# Patient Record
Sex: Male | Born: 1965 | Hispanic: Yes | Marital: Married | Smoking: Former smoker
Health system: Southern US, Community
[De-identification: ages and names within clinical notes are randomized; demographics above are authoritative.]

## PROBLEM LIST (undated history)

## (undated) DIAGNOSIS — K219 Gastro-esophageal reflux disease without esophagitis: Secondary | ICD-10-CM

## (undated) HISTORY — DX: Gastro-esophageal reflux disease without esophagitis: K21.9

## (undated) HISTORY — PX: BACK SURGERY: SHX140

---

## 2008-06-09 ENCOUNTER — Ambulatory Visit: Payer: Self-pay | Admitting: Occupational Medicine

## 2009-01-17 HISTORY — PX: OTHER SURGICAL HISTORY: SHX169

## 2009-03-25 ENCOUNTER — Ambulatory Visit: Payer: Self-pay | Admitting: Family Medicine

## 2009-03-25 DIAGNOSIS — K219 Gastro-esophageal reflux disease without esophagitis: Secondary | ICD-10-CM | POA: Insufficient documentation

## 2009-03-26 ENCOUNTER — Encounter: Payer: Self-pay | Admitting: Family Medicine

## 2009-03-26 LAB — CONVERTED CEMR LAB
Alkaline Phosphatase: 104 units/L (ref 39–117)
CO2: 28 meq/L (ref 19–32)
Calcium: 9.3 mg/dL (ref 8.4–10.5)
Creatinine, Ser: 0.94 mg/dL (ref 0.40–1.50)
Glucose, Bld: 108 mg/dL — ABNORMAL HIGH (ref 70–99)
Helicobacter Pylori Antibody-IgG: <0.4
Potassium: 4.2 meq/L (ref 3.5–5.3)
Sodium: 138 meq/L (ref 135–145)
Total Bilirubin: 0.5 mg/dL (ref 0.3–1.2)

## 2009-04-01 ENCOUNTER — Encounter: Payer: Self-pay | Admitting: Family Medicine

## 2009-05-04 ENCOUNTER — Telehealth: Payer: Self-pay | Admitting: Family Medicine

## 2009-06-11 ENCOUNTER — Encounter (INDEPENDENT_AMBULATORY_CARE_PROVIDER_SITE_OTHER): Payer: Self-pay | Admitting: *Deleted

## 2009-06-11 ENCOUNTER — Telehealth: Payer: Self-pay | Admitting: Family Medicine

## 2009-07-17 ENCOUNTER — Encounter: Payer: Self-pay | Admitting: Family Medicine

## 2009-07-28 ENCOUNTER — Telehealth (INDEPENDENT_AMBULATORY_CARE_PROVIDER_SITE_OTHER): Payer: Self-pay | Admitting: *Deleted

## 2009-09-01 ENCOUNTER — Encounter: Admission: RE | Admit: 2009-09-01 | Discharge: 2009-09-01 | Payer: Self-pay | Admitting: Family Medicine

## 2009-09-01 ENCOUNTER — Ambulatory Visit: Payer: Self-pay | Admitting: Family Medicine

## 2009-09-01 DIAGNOSIS — M549 Dorsalgia, unspecified: Secondary | ICD-10-CM | POA: Insufficient documentation

## 2009-09-02 ENCOUNTER — Encounter: Payer: Self-pay | Admitting: Family Medicine

## 2009-09-02 LAB — CONVERTED CEMR LAB
AST: 24 units/L (ref 0–37)
Alkaline Phosphatase: 102 units/L (ref 39–117)
BUN: 12 mg/dL (ref 6–23)
CO2: 27 meq/L (ref 19–32)
Creatinine, Ser: 0.82 mg/dL (ref 0.40–1.50)
Glucose, Bld: 81 mg/dL (ref 70–99)
Potassium: 4.5 meq/L (ref 3.5–5.3)
Sodium: 135 meq/L (ref 135–145)
Total Bilirubin: 0.6 mg/dL (ref 0.3–1.2)

## 2009-09-04 ENCOUNTER — Telehealth: Payer: Self-pay | Admitting: Family Medicine

## 2009-09-04 DIAGNOSIS — M542 Cervicalgia: Secondary | ICD-10-CM | POA: Insufficient documentation

## 2009-09-07 ENCOUNTER — Telehealth (INDEPENDENT_AMBULATORY_CARE_PROVIDER_SITE_OTHER): Payer: Self-pay | Admitting: *Deleted

## 2009-09-10 ENCOUNTER — Encounter: Payer: Self-pay | Admitting: Family Medicine

## 2009-09-11 ENCOUNTER — Encounter: Payer: Self-pay | Admitting: Family Medicine

## 2009-09-11 LAB — CONVERTED CEMR LAB
CO2: 24 meq/L (ref 19–32)
Calcium: 8.8 mg/dL (ref 8.4–10.5)
Chloride: 103 meq/L (ref 96–112)
Creatinine, Ser: 0.86 mg/dL (ref 0.40–1.50)
HDL: 48 mg/dL (ref >39)
Sodium: 139 meq/L (ref 135–145)
Total Bilirubin: 0.7 mg/dL (ref 0.3–1.2)
VLDL: 23 mg/dL (ref 0–40)

## 2009-09-14 ENCOUNTER — Telehealth: Payer: Self-pay | Admitting: Family Medicine

## 2009-09-15 ENCOUNTER — Telehealth: Payer: Self-pay | Admitting: Family Medicine

## 2009-09-17 ENCOUNTER — Telehealth: Payer: Self-pay | Admitting: Family Medicine

## 2009-10-29 ENCOUNTER — Encounter: Payer: Self-pay | Admitting: Family Medicine

## 2009-12-21 ENCOUNTER — Telehealth: Payer: Self-pay | Admitting: Family Medicine

## 2009-12-28 ENCOUNTER — Encounter: Payer: Self-pay | Admitting: Family Medicine

## 2009-12-29 LAB — CONVERTED CEMR LAB
LDL Cholesterol: 122 mg/dL — ABNORMAL HIGH (ref 0–99)
Total CHOL/HDL Ratio: 4.2
VLDL: 24 mg/dL (ref 0–40)

## 2010-01-15 ENCOUNTER — Telehealth: Payer: Self-pay | Admitting: Family Medicine

## 2010-01-26 ENCOUNTER — Encounter: Payer: Self-pay | Admitting: Family Medicine

## 2010-01-28 ENCOUNTER — Ambulatory Visit
Admission: RE | Admit: 2010-01-28 | Discharge: 2010-01-28 | Payer: Self-pay | Source: Home / Self Care | Attending: Family Medicine | Admitting: Family Medicine

## 2010-01-28 ENCOUNTER — Encounter: Payer: Self-pay | Admitting: Family Medicine

## 2010-01-28 LAB — CONVERTED CEMR LAB: Blood Glucose, AC Bkfst: 100 mg/dL

## 2010-01-29 ENCOUNTER — Encounter: Payer: Self-pay | Admitting: Family Medicine

## 2010-01-29 LAB — CONVERTED CEMR LAB
Cholesterol: 261 mg/dL — ABNORMAL HIGH (ref 0–200)
LDL Cholesterol: 182 mg/dL — ABNORMAL HIGH (ref 0–99)
Triglycerides: 158 mg/dL — ABNORMAL HIGH (ref <150)

## 2010-02-01 ENCOUNTER — Encounter: Payer: Self-pay | Admitting: Family Medicine

## 2010-02-01 DIAGNOSIS — Q761 Klippel-Feil syndrome: Secondary | ICD-10-CM | POA: Insufficient documentation

## 2010-02-02 ENCOUNTER — Encounter: Payer: Self-pay | Admitting: Family Medicine

## 2010-02-16 NOTE — Progress Notes (Signed)
Summary: Triage Call- Acid Reflux question   Phone Note Call from Patient   Caller: Patient Summary of Call: Wife called and wants to know if their is a  specialist he can see for Acid Reflux? Would that be Gastro or ENT? Call Wife-Paula at [phone] or AFTER 4PM call, [phone] Thanks, Michaelle Copas  Initial call taken by: Michaelle Copas,  Jun 11, 2009 3:11 PM  Follow-up for Phone Call        Additional follow up Details #1::        Called pt and he asked how long would have to be on the Dexilant- instructed pt that I thought indefinitely and if he did not want to do that we could set him up with GI for further evaluation. Pt states he would call back if decided to see GI Additional Follow-up by: Kathlene November,  May 04, 2009 4:26 PM  Follow-up by: Payton Spark CMA,  Jun 11, 2009 3:31 PM  Additional Follow-up for Phone Call Additional follow up Details #1::        referral put in -- print under orders section. Additional Follow-up by: Seymour Bars DO,  Jun 11, 2009 3:48 PM     Appended Document: Triage Call- Acid Reflux question  Wife aware

## 2010-02-16 NOTE — Medication Information (Signed)
Summary: Approval for Dexilant/[facility]  Approval for Dexilant/[facility]   Imported By: Lanelle Bal 04/07/2009 09:10:14  _____________________________________________________________________  External Attachment:    Type:   Image     Comment:   External Document

## 2010-02-16 NOTE — Letter (Signed)
Summary: New Patient letter  [facility]  [address] [place], [place] [address]   Phone: [phone]  Fax: [phone]       06/11/2009 MRN: [id number]  Roy Mueller [address] [name], [place]  [address]  Dear [name],  Welcome to the Gastroenterology Division at [facility].    You are scheduled to see Dr.  Christella Hartigan on 07-07-09 at 1:30p.m. on the 3rd floor at [facility], [address]  We ask that you try to arrive at our office 15 minutes prior to your appointment time to allow for check-in.  We would like you to complete the enclosed self-administered evaluation form prior to your visit and bring it with you on the day of your appointment.  We will review it with you.  Also, please bring a complete list of all your medications or, if you prefer, bring the medication bottles and we will list them.  Please bring your insurance card so that we may make a copy of it.  If your insurance requires a referral to see a specialist, please bring your referral form from your primary care physician.  Co-payments are due at the time of your visit and may be paid by cash, check or credit card.     Your office visit will consist of a consult with your physician (includes a physical exam), any laboratory testing he/she may order, scheduling of any necessary diagnostic testing (e.g. x-ray, ultrasound, CT-scan), and scheduling of a procedure (e.g. Endoscopy, Colonoscopy) if required.  Please allow enough time on your schedule to allow for any/all of these possibilities.    If you cannot keep your appointment, please call [phone] to cancel or reschedule prior to your appointment date.  This allows [place] the opportunity to schedule an appointment for another patient in need of care.  If you do not cancel or reschedule by 5 p.m. the business day prior to your appointment date, you will be charged a $50.00 late cancellation/no-show fee.    Thank you for  choosing [facility] for your medical needs.  We appreciate the opportunity to care for you.  Please visit [place] at our website  to learn more about our practice.                     Sincerely,                                                             The Gastroenterology Division

## 2010-02-16 NOTE — Progress Notes (Signed)
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 Also some formainal stenosis at C6-7. REcommend ortho referral  Initial call taken by: Nani Gasser MD,  September 14, 2009 12:46 PM  Follow-up for Phone Call        [facility] for pt to return call for results Follow-up by: Kathlene November,  September 14, 2009 1:03 PM  Additional Follow-up for Phone Call Additional follow up Details #1::        Pt would like to see a neuro surgeon rather than ortho for his neck. Additional Follow-up by: Kathlene November,  September 14, 2009 1:30 PM

## 2010-02-16 NOTE — Progress Notes (Signed)
Summary: Lab order  Phone Note Call from Patient   Caller: Spouse Summary of Call: pt wants to get his cholesterol check on Thursday 09/10/09, he needs a order to do so... THe last time he was here he had ate already so that is why he is coming back... Call [phone] Gunnar Fusi- Wife) Initial call taken by: Michaelle Copas,  September 07, 2009 12:12 PM  Follow-up for Phone Call        ok will fax order to lab Follow-up by: Avon Gully CMA, Duncan Dull),  September 07, 2009 3:40 PM

## 2010-02-16 NOTE — Progress Notes (Signed)
Summary: neoru referral  Phone Note Call from Patient   Caller: Spouse-paula Summary of Call: Dr.Metheney   Call back  [phone]  Patient wife called and want to know if he can get a Referral to see a Nuro Dr. He was only seen here for Heart Burn so not sure if the pt need an appt here first Initial call taken by: Vanessa [place],  July 28, 2009 2:42 PM  Follow-up for Phone Call        Yes pt will need appointment first- we can not refer and give diagnosis without knowing what his symptoms are. Please call to schedule pt. Thanks Follow-up by: Kathlene November,  July 28, 2009 3:34 PM    Additional Follow-up for Phone Call Additional follow up Details #2::    SPOKE TO PATIENTS WIFE AND SHE SAID SHE WAS NOT GOING TO SCHEDULE AN APPT. Follow-up by: Vanessa [place],  July 29, 2009 2:36 PM

## 2010-02-16 NOTE — Progress Notes (Signed)
Summary: Pt question  Phone Note Call from Patient Call back at [phone]   Caller: Patient Reason for Call: Talk to Doctor Summary of Call: Pls contact pt between 12:00-12:30 or after 3:30, he has a question about his blood test and his heartburn meds. Initial call taken by: Lannette Donath,  May 04, 2009 9:50 AM  Follow-up for Phone Call        [place]arrison. Selena Batten will you try to call him today and see what he needs.  Follow-up by: Nani Gasser MD,  May 04, 2009 10:13 AM  Additional Follow-up for Phone Call Additional follow up Details #1::        Called pt and he asked how long would have to be on the Dexilant- instructed pt that I thought indefinitely and if he did not want to do that we could set him up with GI for further evaluation. Pt states he would call back if decided to see GI Additional Follow-up by: Kathlene November,  May 04, 2009 4:26 PM

## 2010-02-16 NOTE — Assessment & Plan Note (Signed)
Summary: NeckPain, GERD   Vital Signs:  Patient profile:   45 year old male Height:      70 inches Weight:      187 pounds Pulse rate:   100 / minute BP sitting:   125 / 75  (left arm)  Vitals Entered By: Avon Gully CMA, Duncan Dull) (September 01, 2009 3:41 PM) CC: pain in neck and shoulder x 8 months to a year, wants a referral  pain is at times an 8 on a scale of 1-10,refill meds,bld work   Primary Care Rawn Quiroa:  Nani Gasser MD  CC:  pain in neck and shoulder x 8 months to a year, wants a referral  pain is at times an 8 on a scale of 1-10, refill meds, and bld work.  History of Present Illness: Heartburn medicine is working well. Needs refill on the medication. Taking it every other day and it is controlling his sxs as well.   Pain in his left shoulder for 8  months.  Gets a burning sensation to the left side towards the shoulder, between the upper spine and teh shoulder.  Occ feels weak in his left arm. No trauma to his neck.  When drives a long distance will feel it more.  No pain meds currently but has treid NSAIDs and muscle relaxers previously and theyd didn't help. No alleviating sxs.  No numbness in the left arm, thought says occ will have numbness in his left hand but is intermittant.  Feels like a weight at the base of his neck. Somedays the pain is severe.  Say he would really like to see a neurologist for this.   Current Medications (verified): 1)  Dexilant 60 Mg Cpdr (Dexlansoprazole) .... Take 1 Tablet By Mouth Once A Day in The Am.  Allergies (verified): No Known Drug Allergies  Comments:  Nurse/Medical Assistant: The patient's medications and allergies were reviewed with the patient and were updated in the Medication and Allergy Lists. 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   Physical Exam  General:  Well-developed,well-nourished,in no acute distress; alert,appropriate and cooperative throughout examination Msk:  Neck with NROM and shoulders wtih NROM. No pain with motion.  Shoulder, elbow, and wrist strength 5/5 bilat. Nontender over the cervial and thoracic spine. Some tenderness between the upper thoracic spine and the scapula.   Pulses:  Radial 2+  Skin:  no rashes.   Psych:  Cognition and judgment appear intact. Alert and cooperative with normal attention span and concentration. No apparent delusions, illusions, hallucinations   Impression & Recommendations:  Problem # 1:  BACK PAIN, UPPER (ICD-724.5) Discussed dx of possible muscle strain vs nerve impingement from the neck.  Will get xray since this has been going on for 8 months and not getting better. If abnormal then can consider referral to Neurology or othopedics.  Pt prefers Neurology.  In the meantime Can use NSAIDs as needed.  Pt would also like to have his liver and kidney checked.  Orders: T-DG Cervical Spine 2-3 Views [phone]) T-DG Thoracic Spine 2 Views [phone]) T-Comprehensive Metabolic Panel [phone])  Problem # 2:  GERD (ICD-530.81) IMproved. Take med every other day. at next refill consider dropping back to an H2 blocker.  His updated medication list for this problem  includes:    Dexilant 60 Mg Cpdr (Dexlansoprazole) .[name]... Take 1 tablet by mouth once a day in the am.  Complete Medication List: 1)  Dexilant 60 Mg Cpdr (Dexlansoprazole) .... Take 1 tablet by mouth once a day in the am. Prescriptions: DEXILANT 60 MG CPDR (DEXLANSOPRAZOLE) Take 1 tablet by mouth once a day in the AM.  #30 x 2   Entered and Authorized by:   Nani Gasser MD   Signed by:   Nani Gasser MD on 09/01/2009   Method used:   Electronically to        [facility]* (retail)       [address] [place], [place]   [address]       Ph: [phone]       Fax: [phone]   RxID:   [id number]

## 2010-02-16 NOTE — Progress Notes (Signed)
Summary: triage  Phone Note Call from Patient   Caller: Spouse Summary of Call: Wife- Gunnar Fusi called and wants to know if we have a list of ways to lower his blad Cholesterol... Call her back at [phone] Initial call taken by: Michaelle Copas,  September 17, 2009 10:52 AM  Follow-up for Phone Call        LM for a return call Follow-up by: Kathlene November,  September 17, 2009 11:20 AM  Additional Follow-up for Phone Call Additional follow up Details #1::        Spoke with wife instructed her on diet, exercise control. Oatmeal, apples, kidney beans, fish 2 servings a week, nuts handful a day and using extra virgin olive oil and cutting back on saturated fats and whole dairy products would all help in decreasing the LDL as well as fish oil daily Additional Follow-up by: Kathlene November,  September 17, 2009 11:36 AM    Additional Follow-up for Phone Call Additional follow up Details #2::    We can also send some handouts.  Follow-up by: Nani Gasser MD,  September 17, 2009 4:37 PM  Additional Follow-up for Phone Call Additional follow up Details #3:: Details for Additional Follow-up Action Taken: Mailed handouts to patient home address Additional Follow-up by: Kathlene November,  September 17, 2009 4:46 PM

## 2010-02-16 NOTE — Consult Note (Signed)
Summary: [facility]   Imported By: Lanelle Bal 08/06/2009 09:49:23  _____________________________________________________________________  External Attachment:    Type:   Image     Comment:   External Document

## 2010-02-16 NOTE — Consult Note (Signed)
[facility]   Imported By: Lanelle Bal 11/12/2009 09:20:13  _____________________________________________________________________  External Attachment:    Type:   Image     Comment:   External Document

## 2010-02-16 NOTE — Progress Notes (Signed)
Summary: MRI  Phone Note Call from Patient   Caller: Spouse Call For: Nani Gasser MD Summary of Call: Pt's wife called and they would like to schedule MRI. Initial call taken by: Avon Gully CMA, Duncan Dull),  September 04, 2009 2:32 PM  New Problems: NECK PAIN (ICD-723.1)   New Problems: NECK PAIN (ICD-723.1)

## 2010-02-16 NOTE — Assessment & Plan Note (Signed)
Summary: NOV: GERD   Vital Signs:  Patient profile:   45 year old male Height:      70 inches Weight:      190 pounds BMI:     27.36 Pulse rate:   71 / minute BP sitting:   103 / 62  (left arm) Cuff size:   regular  Vitals Entered By: Kathlene November (March 25, 2009 2:51 PM) CC: NP- having alot of heartburn and acid reflux when uses prevacid OTC does help but when stops the med comes back. Was on Nexium as well 3 years ago Is Patient Diabetic? No   CC:  NP- having alot of heartburn and acid reflux when uses prevacid OTC does help but when stops the med comes back. Was on Nexium as well 3 years ago.  History of Present Illness: NP- having alot of heartburn and acid reflux when uses prevacid OTC does help but when stops the med  after 14 days as directed on teh package then comes back. Was on Nexium as well, about  3 years ago.  Has been worse recently.  Bothers him with everything he eats.  Has been a burning adn pressure  sensation. Worse when bends over.  No brash.  Feels like food comes back  up to his neck.  Never had endoscopy. Never been tested for H. pylor. WAs told it was not an ulcer in the past with an upper GI> Never been tested for H. pylori. No dysphagia.   Habits & Providers  Alcohol-Tobacco-Diet     Alcohol drinks/day: 0     Tobacco Status: current  Exercise-Depression-Behavior     Does Patient Exercise: no     STD Risk: never     Drug Use: never     Seat Belt Use: always  Current Medications (verified): 1)  None  Allergies (verified): No Known Drug Allergies  Comments:  Nurse/Medical Assistant: The patient's medications and allergies were reviewed with the patient and were updated in the Medication and Allergy Lists. Kathlene November (March 25, 2009 2:53 PM)  Past History:  Past Medical History: Last updated: 06/09/2008 Unremarkable  Past Surgical History: Last updated: 06/09/2008 Denies surgical history  Family History: Last updated:  06/09/2008 Mother, Healthy Father, Healthy Sister, Healthy  Social History: Personnel officer.   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 Smoking Status:  current Does Patient Exercise:  no STD Risk:  never Drug Use:  never Seat Belt Use:  always  Review of Systems       No fever/sweats/weakness, unexplained weight loss/gain.  No vison changes.  No difficulty hearing/ringing in ears, hay fever/allergies.  No chest pain/discomfort, palpitations.  No Br lump/nipple discharge.  No cough/wheeze.  No blood in BM, nausea/vomiting/diarrhea.  No nighttime urination, leaking urine, unusual vaginal bleeding, discharge (penis or vagina).  No muscle/joint pain. No rash, change in mole.  No HA, memory loss.  No anxiety, sleep d/o, depression.  No easy bruising/bleeding, unexplained lump   Physical Exam  General:  Well-developed,well-nourished,in no acute distress; alert,appropriate and cooperative throughout examination Head:  Normocephalic and atraumatic without obvious abnormalities. No apparent alopecia or balding. Mouth:  Oral mucosa and oropharynx without lesions or exudates.  Teeth in good repair. Neck:  No deformities, masses, or tenderness noted. Lungs:  Normal respiratory effort, chest expands symmetrically. Lungs are clear to auscultation, no crackles or wheezes. Heart:  Normal rate and regular rhythm. S1 and S2 normal without gallop, murmur,  click, rub or other extra sounds. Abdomen:  Bowel sounds positive,abdomen soft and non-tender without masses, organomegaly or hernias noted. Skin:  no rashes.   Cervical Nodes:  No lymphadenopathy noted Psych:  Cognition and judgment appear intact. Alert and cooperative with normal attention span and concentration. No apparent delusions, illusions, hallucinations   Impression & Recommendations:  Problem # 1:  GERD (ICD-530.81)  Discussed reflux hygiene. REviewed how important it is to make these  changes and especially with his caffeine intake. Doesn't drink soda.  will start with dexlinat once a day and then try to wean down after 8-12 weeks. Discussed how we try to do this.  Will also test for h pylori and treat if + since has never been treated for this. If still having resistant signs on the PPI then will refer to GI.    His updated medication list for this problem includes:    Dexilant 60 Mg Cpdr (Dexlansoprazole) .[name]... Take 1 tablet by mouth once a day in the am.  Orders: T-Helicobacter AB - IgG ([id number]) T-Comprehensive Metabolic Panel ([id number])  Complete Medication List: 1)  Dexilant 60 Mg Cpdr (Dexlansoprazole) .... Take 1 tablet by mouth once a day in the am. Prescriptions: DEXILANT 60 MG CPDR (DEXLANSOPRAZOLE) Take 1 tablet by mouth once a day in the AM.  #30 x 2   Entered and Authorized by:   Nani Gasser MD   Signed by:   Nani Gasser MD on 03/25/2009   Method used:   Electronically to        [facility]* (retail)       [address] [place], [place]  [address]       Ph: [phone]       Fax: [phone]   RxID:   [id number]

## 2010-02-16 NOTE — Progress Notes (Signed)
Summary: referral  Phone Note Call from Patient   Caller: Patient Call For: Nani Gasser MD Summary of Call: Pt's wife called and wants a neurology refferal Initial call taken by: Avon Gully CMA, Duncan Dull),  September 15, 2009 11:41 AM  Follow-up for Phone Call        I thought they wanted to see neurosurgery? or do they want to see neurology?   Follow-up by: Nani Gasser MD,  September 15, 2009 11:58 AM  Additional Follow-up for Phone Call Additional follow up Details #1::        They want to see a neurosurgeon Additional Follow-up by: Kathlene November,  September 15, 2009 12:00 PM

## 2010-02-16 NOTE — Progress Notes (Signed)
  Phone Note Call from Patient      

## 2010-02-18 NOTE — Op Note (Signed)
Summary: Neck surgery     Past History:  Past Surgical History: C5-7 anterior cervical diskectomy and fusion with Crystal Peek spacer.  2011

## 2010-02-18 NOTE — Assessment & Plan Note (Signed)
Summary: CPE   Vital Signs:  Patient profile:   45 year old male Height:      70 inches Weight:      183 pounds BMI:     26.35 Pulse rate:   106 / minute BP sitting:   108 / 68  (right arm) Cuff size:   regular  Vitals Entered By: Avon Gully CMA, Duncan Dull) (January 28, 2010 10:48 AM) CC: CPE and form   Primary Care Provider:  Nani Gasser MD  CC:  CPE and form.  History of Present Illness: Here for CPE. Doing a cervical disckectomy next week.  Had his pre-op this week.  Has form that needs to complete for his work. PUt up to date chol and sugar on there as well.   Current Medications (verified): 1)  Dexilant 60 Mg Cpdr (Dexlansoprazole) .... Take 1 Tablet By Mouth Once A Day in The Am.  Allergies (verified): No Known Drug Allergies  Comments:  Nurse/Medical Assistant: The patient's medications and allergies were reviewed with the patient and were updated in the Medication and Allergy Lists. 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   Review of Systems  The patient denies anorexia, fever, weight loss, weight gain, vision loss, decreased hearing, hoarseness, chest pain, syncope, dyspnea on exertion, peripheral edema, prolonged cough, headaches, hemoptysis, abdominal pain, melena, hematochezia, severe indigestion/heartburn, hematuria, incontinence, genital sores, muscle weakness, suspicious skin lesions, transient blindness, difficulty walking, depression, unusual weight change, abnormal bleeding, enlarged lymph nodes, and angioedema.    Physical  Exam  General:  Well-developed,well-nourished,in no acute distress; alert,appropriate and cooperative throughout examination Head:  Normocephalic and atraumatic without obvious abnormalities. No apparent alopecia or balding. Eyes:  No corneal or conjunctival inflammation noted. EOMI. Perrla.  Ears:  External ear exam shows no significant lesions or deformities.  Otoscopic examination reveals clear canals, tympanic membranes are intact bilaterally without bulging, retraction, inflammation or discharge. Hearing is grossly normal bilaterally. Nose:  External nasal examination shows no deformity or inflammation. Nasal mucosa are pink and moist without lesions or exudates. Mouth:  Oral mucosa and oropharynx without lesions or exudates.  Teeth in good repair. Neck:  No deformities, masses, or tenderness noted. Chest Wall:  No deformities, masses, tenderness or gynecomastia noted. Lungs:  Normal respiratory effort, chest expands symmetrically. Lungs are clear to auscultation, no crackles or wheezes. Heart:  Normal rate and regular rhythm. S1 and S2 normal without gallop, murmur, click, rub or other extra sounds. Abdomen:  Bowel sounds positive,abdomen soft and non-tender without masses, organomegaly or hernias noted. Msk:  No deformity or scoliosis noted of thoracic or lumbar spine.   Pulses:  R and L carotid,radial,dorsalis pedis and posterior tibial pulses are full and equal bilaterally Extremities:  No clubbing, cyanosis, edema, or deformity noted with normal full range of motion of all joints.   Neurologic:  No cranial nerve deficits noted. Station and gait are normal. DTRs are symmetrical throughout. Sensory, motor and coordinative functions appear intact. Skin:  no rashes.   Cervical Nodes:  No lymphadenopathy noted Psych:  Cognition and judgment appear intact. Alert and cooperative with normal attention span and concentration. No apparent delusions, illusions, hallucinations  Impression &  Recommendations:  Problem # 1:  HEALTH MAINTENANCE EXAM (ICD-V70.0)  Exam isnormal today Recheck sugar in one year Cholesterol is up to date.  Encourage regular exercise and healthy diet.  Orders: T-Lipid Profile ([id number]) Fingerstick (36416) Glucose, (CBG) (01027)  Complete Medication List: 1)  Dexilant 60 Mg Cpdr (Dexlansoprazole) .... Take 1 tablet by mouth once a day in the am.   Orders Added: 1)  Est. Patient age 62-64 [56] 2)  T-Lipid Profile [phone] 3)  Fingerstick [36416] 4)  Glucose, (CBG) [[id number]]   Immunization History:  Tetanus/Td Immunization History:    Tetanus/Td:  historical (01/17/2005)   Immunization History:  Tetanus/Td Immunization History:    Tetanus/Td:  Historical (01/17/2005)       Immunization History:  Tetanus/Td Immunization History:    Tetanus/Td:  historical (01/17/2005)   Contraindications/Deferment of Procedures/Staging:    Test/Procedure: FLU VAX    Reason for deferment: patient declined  Laboratory Results   Blood Tests   Date/Time Received: 01/28/10 Date/Time Reported: 01/28/10  CBG Fasting:: 100mg /dL

## 2010-02-18 NOTE — Progress Notes (Signed)
Summary: Needs appt  ---- Converted from flag ---- ---- 09/11/2009 10:00 AM, Nani Gasser MD wrote: Credit acct for CMP from 09/10/2009. ------------------------------

## 2010-02-18 NOTE — Letter (Signed)
Summary: [facility]   Imported By: Lanelle Bal 02/10/2010 10:17:27  _____________________________________________________________________  External Attachment:    Type:   Image     Comment:   External Document

## 2010-02-24 NOTE — Op Note (Signed)
Summary: Diskectomy/[facility]  Diskectomy/[facility]   Imported By: Lanelle Bal 02/18/2010 13:55:01  _____________________________________________________________________  External Attachment:    Type:   Image     Comment:   External Document

## 2010-03-04 ENCOUNTER — Encounter: Payer: Self-pay | Admitting: Family Medicine

## 2010-03-04 NOTE — Letter (Signed)
Summary: Health Provider Screening Form  Health Provider Screening Form   Imported By: Maryln Gottron 02/25/2010 12:58:25  _____________________________________________________________________  External Attachment:    Type:   Image     Comment:   External Document

## 2010-03-25 NOTE — Letter (Signed)
Summary: [facility]   Imported By: Lanelle Bal 03/19/2010 11:17:08  _____________________________________________________________________  External Attachment:    Type:   Image     Comment:   External Document

## 2010-04-16 ENCOUNTER — Other Ambulatory Visit: Payer: Self-pay | Admitting: Family Medicine

## 2010-07-09 ENCOUNTER — Telehealth: Payer: Self-pay | Admitting: Family Medicine

## 2010-07-09 DIAGNOSIS — E785 Hyperlipidemia, unspecified: Secondary | ICD-10-CM

## 2010-07-09 NOTE — Telephone Encounter (Signed)
Pt would like to do a fasting chol panel..  Last done in January of this year. Jarvis Newcomer, LPN [place]

## 2010-07-09 NOTE — Telephone Encounter (Signed)
OK. Lab slip faxed.

## 2010-07-12 NOTE — Telephone Encounter (Signed)
Notified pt that a lab order was sent down and he could go anytime MON-FRI 8a--5pm.  Had to [facility]. Jarvis Newcomer, LPN [place]

## 2010-08-13 ENCOUNTER — Other Ambulatory Visit: Payer: Self-pay | Admitting: Family Medicine

## 2010-09-04 LAB — LIPID PANEL
HDL: 56 mg/dL (ref >39)
LDL Cholesterol: 144 mg/dL — ABNORMAL HIGH (ref 0–99)
Total CHOL/HDL Ratio: 3.9 Ratio
VLDL: 18 mg/dL (ref 0–40)

## 2010-09-05 ENCOUNTER — Telehealth: Payer: Self-pay | Admitting: Family Medicine

## 2010-09-05 NOTE — Telephone Encounter (Signed)
Call pt: Chol better than it was 7 mo ago. Still not normal but better. Keep up the good work, recheck in 1 year.

## 2010-09-07 NOTE — Telephone Encounter (Signed)
Left message on vm

## 2011-03-02 ENCOUNTER — Encounter: Payer: Self-pay | Admitting: *Deleted

## 2011-03-11 ENCOUNTER — Ambulatory Visit: Payer: Self-pay | Admitting: Family Medicine

## 2011-03-18 ENCOUNTER — Encounter: Payer: Self-pay | Admitting: Family Medicine

## 2011-04-01 ENCOUNTER — Encounter: Payer: Self-pay | Admitting: Family Medicine

## 2011-04-01 ENCOUNTER — Other Ambulatory Visit: Payer: Self-pay | Admitting: Family Medicine

## 2011-04-01 ENCOUNTER — Telehealth: Payer: Self-pay | Admitting: Family Medicine

## 2011-04-01 ENCOUNTER — Ambulatory Visit (INDEPENDENT_AMBULATORY_CARE_PROVIDER_SITE_OTHER): Payer: BC Managed Care – PPO | Admitting: Family Medicine

## 2011-04-01 VITALS — BP 127/77 | HR 95 | Ht 70.0 in | Wt 197.0 lb

## 2011-04-01 DIAGNOSIS — Z7289 Other problems related to lifestyle: Secondary | ICD-10-CM

## 2011-04-01 DIAGNOSIS — Z0000 Encounter for general adult medical examination without abnormal findings: Secondary | ICD-10-CM

## 2011-04-01 DIAGNOSIS — R350 Frequency of micturition: Secondary | ICD-10-CM

## 2011-04-01 DIAGNOSIS — E785 Hyperlipidemia, unspecified: Secondary | ICD-10-CM

## 2011-04-01 DIAGNOSIS — L259 Unspecified contact dermatitis, unspecified cause: Secondary | ICD-10-CM

## 2011-04-01 DIAGNOSIS — R21 Rash and other nonspecific skin eruption: Secondary | ICD-10-CM

## 2011-04-01 DIAGNOSIS — L309 Dermatitis, unspecified: Secondary | ICD-10-CM

## 2011-04-01 LAB — COMPLETE METABOLIC PANEL WITH GFR
AST: 28 U/L (ref 0–37)
Alkaline Phosphatase: 131 U/L — ABNORMAL HIGH (ref 39–117)
BUN: 18 mg/dL (ref 6–23)
CO2: 24 mEq/L (ref 19–32)
Creat: 0.93 mg/dL (ref 0.50–1.35)
Glucose, Bld: 93 mg/dL (ref 70–99)
Potassium: 5 mEq/L (ref 3.5–5.3)

## 2011-04-01 LAB — LIPID PANEL
LDL Cholesterol: 159 mg/dL — ABNORMAL HIGH (ref 0–99)
VLDL: 23 mg/dL (ref 0–40)

## 2011-04-01 NOTE — Telephone Encounter (Signed)
Please call the lab and see if we can add a PSA to his blood work that was drawn today.

## 2011-04-01 NOTE — Progress Notes (Addendum)
Subjective:    Patient ID: Roy Mueller, male    DOB: June 21, 1965, 46 y.o.   MRN: [id number]  HPI Here for CPE. He is using e-cis now.  No specific complaints. Rash on his leg for months.  Says has had really dry skin since a child. Mother with hx of eczema. Says feelsmore sluggish and would like his testosterone checked.  He has been taking red yeast rice to help with his cholesterol. He was working out fairly regularly but says that has decreased since his work hours have increased.  Fro about a year his urination is stopping and starting.  At most only gets up at night once to pee, most nights none.   BP 127/77  Pulse 95  Ht 5\' 10"  (1.778 m)  Wt 197 lb (89.359 kg)  BMI 28.27 kg/m2    No Known Allergies  Past Medical History  Diagnosis Date  . GERD (gastroesophageal reflux disease)     Past Surgical History  Procedure Date  . C5-7 cervical diskectomy 2011    and fusion with Crystal Peek spacer    History   Social History  . Marital Status: Married    Spouse Name: N/A    Number of Children: 2   . Years of Education: N/A   Occupational History  .     Social History Main Topics  . Smoking status: Current Everyday Smoker -- 0.2 packs/day for 3 years    Types: Cigarettes  . Smokeless tobacco: Not on file  . Alcohol Use: 1.2 oz/week    2 Cans of beer per week  . Drug Use: Yes  . Sexually Active: Yes -- Male partner(s)   Other Topics Concern  . Not on file   Social History Narrative   No regular exercise.      History reviewed. No pertinent family history.    Review of Systems comprehensive ROS is negative except for above. No CP or SOB.  .   Objective:   Physical Exam  Constitutional: He is oriented to person, place, and time. He appears well-developed and well-nourished.  HENT:  Head: Normocephalic and atraumatic.  Right Ear: External ear normal.  Left Ear: External ear normal.  Nose: Nose normal.  Mouth/Throat: Oropharynx is clear and moist.    Eyes: Conjunctivae and EOM are normal. Pupils are equal, round, and reactive to light.  Neck: Normal range of motion. Neck supple. No thyromegaly present.  Cardiovascular: Normal rate, regular rhythm, normal heart sounds and intact distal pulses.   Pulmonary/Chest: Effort normal and breath sounds normal.  Abdominal: Soft. Bowel sounds are normal. He exhibits no distension and no mass. There is no tenderness. There is no rebound and no guarding.  Musculoskeletal: Normal range of motion.  Lymphadenopathy:    He has no cervical adenopathy.  Neurological: He is alert and oriented to person, place, and time. He has normal reflexes.  Skin: Skin is warm and dry.       He is a dry scaling erythematous rash on his right upper thigh.  Psychiatric: He has a normal mood and affect. His behavior is normal. Judgment and thought content normal.          Assessment & Plan:  CPE-Exam is normal today. Due for CMP, lipid panel, and he would like testosterone checked as well. Start a regular exercise program and make sure you are eating a healthy diet Try to eat 4 servings of dairy a day or take a calcium supplement (500mg  twice a day).  Your vaccines are up to date.   Alcohol  Use- will schedule for liever US> he reports 2 beers per week. He wife reports binge drinking on the weekends. He would like to get his liver evaluated.    Eczema - Did KOH scrai]ping to rule out fungus.  If neg then tx with triamcinolone cream. We also discussed the importance of moisturizing the skin very well and avoid very hot showers. Next  Hyperlipidemia-he has been taking red yeast rice to see if this will help with his cholesterol. He really wants to do it naturally. He is alert he had some success in that he was able to bring his LDL down from 188 to about 140. We will recheck his lipid level today.  Urinary frequency-I did have him complete an AUA. His AUA score was 13. He describes his quality of life as mixed. This was  but his symptoms into the moderate category. No significant PSA to his blood work. If it is normal in schedule followup visit to discuss potential treatment for BPH. I did not do a prostate exam today said if he follows up we will need to do it then to confirm enlargement.  Fatigue - at his completed testosterone questionnaire. He did answer yes to 5/10 questions. We will then check a testosterone level today.

## 2011-04-01 NOTE — Patient Instructions (Signed)
Start a regular exercise program and make sure you are eating a healthy diet Try to eat 4 servings of dairy a day or take a calcium supplement (500mg twice a day). Your vaccines are up to date.  We will call you with your lab results. If you don't here from us in about a week then please give us a call at [phone].  

## 2011-04-04 ENCOUNTER — Telehealth: Payer: Self-pay | Admitting: Family Medicine

## 2011-04-04 ENCOUNTER — Other Ambulatory Visit: Payer: Self-pay | Admitting: Family Medicine

## 2011-04-04 LAB — TESTOSTERONE, FREE, TOTAL, SHBG
Testosterone, Free: 27.3 pg/mL — ABNORMAL LOW (ref 47.0–244.0)
Testosterone-% Free: 1.5 % — ABNORMAL LOW (ref 1.6–2.9)
Testosterone: 179.1 ng/dL — ABNORMAL LOW (ref 250–890)

## 2011-04-04 LAB — PSA: PSA: 0.26 ng/mL (ref <=4.00)

## 2011-04-04 MED ORDER — ATORVASTATIN CALCIUM 40 MG PO TABS: 40.0000 mg | ORAL_TABLET | Freq: Every day | ORAL | Status: DC

## 2011-04-04 NOTE — Telephone Encounter (Signed)
Labs added.

## 2011-04-06 ENCOUNTER — Telehealth: Payer: Self-pay | Admitting: *Deleted

## 2011-04-06 MED ORDER — TRIAMCINOLONE ACETONIDE 0.5 % EX OINT: TOPICAL_OINTMENT | Freq: Two times a day (BID) | CUTANEOUS | Status: DC

## 2011-04-06 NOTE — Telephone Encounter (Signed)
Pt needs topical steroid sent to pharm for eczema

## 2011-04-06 NOTE — Telephone Encounter (Signed)
rx sent

## 2011-04-06 NOTE — Telephone Encounter (Signed)
Error

## 2011-04-22 ENCOUNTER — Telehealth: Payer: Self-pay | Admitting: *Deleted

## 2011-04-22 ENCOUNTER — Other Ambulatory Visit: Payer: BC Managed Care – PPO

## 2011-04-22 NOTE — Telephone Encounter (Signed)
Limited would be fine

## 2011-04-22 NOTE — Telephone Encounter (Signed)
[facility] called and wants to know if they need to do an  U/s of the liver complete or limited. Pt has had elevated liver functions

## 2011-04-26 NOTE — Telephone Encounter (Signed)
Already left message for Roy Mueller at [facility] to change to limited

## 2011-04-28 ENCOUNTER — Other Ambulatory Visit: Payer: Self-pay | Admitting: Family Medicine

## 2011-05-06 ENCOUNTER — Inpatient Hospital Stay: Admission: RE | Admit: 2011-05-06 | Payer: BC Managed Care – PPO | Source: Ambulatory Visit

## 2011-05-10 ENCOUNTER — Telehealth: Payer: Self-pay | Admitting: *Deleted

## 2011-05-10 DIAGNOSIS — R899 Unspecified abnormal finding in specimens from other organs, systems and tissues: Secondary | ICD-10-CM

## 2011-05-11 ENCOUNTER — Telehealth: Payer: Self-pay | Admitting: *Deleted

## 2011-05-11 DIAGNOSIS — R899 Unspecified abnormal finding in specimens from other organs, systems and tissues: Secondary | ICD-10-CM

## 2011-05-11 NOTE — Telephone Encounter (Signed)
Recheck CMP because liver enzymes were elevated

## 2011-05-13 ENCOUNTER — Ambulatory Visit
Admission: RE | Admit: 2011-05-13 | Discharge: 2011-05-13 | Disposition: A | Discharge status: Home / Self Care | Payer: BC Managed Care – PPO | Source: Ambulatory Visit | Attending: Family Medicine | Admitting: Family Medicine

## 2011-05-13 DIAGNOSIS — Z7289 Other problems related to lifestyle: Secondary | ICD-10-CM

## 2011-05-13 LAB — COMPLETE METABOLIC PANEL WITH GFR
ALT: 43 U/L (ref 0–53)
Alkaline Phosphatase: 115 U/L (ref 39–117)
Chloride: 102 mEq/L (ref 96–112)
Creat: 0.78 mg/dL (ref 0.50–1.35)

## 2011-05-14 LAB — PSA: PSA: 0.23 ng/mL (ref <=4.00)

## 2011-05-14 LAB — LIPID PANEL
HDL: 49 mg/dL (ref >39)
LDL Cholesterol: 117 mg/dL — ABNORMAL HIGH (ref 0–99)
Triglycerides: 177 mg/dL — ABNORMAL HIGH (ref <150)
VLDL: 35 mg/dL (ref 0–40)

## 2011-05-14 LAB — TESTOSTERONE: Testosterone: 440.1 ng/dL (ref 300–890)

## 2011-05-17 ENCOUNTER — Other Ambulatory Visit: Payer: Self-pay | Admitting: Family Medicine

## 2011-05-17 DIAGNOSIS — K769 Liver disease, unspecified: Secondary | ICD-10-CM

## 2011-05-23 ENCOUNTER — Telehealth: Payer: Self-pay | Admitting: *Deleted

## 2011-05-23 DIAGNOSIS — K769 Liver disease, unspecified: Secondary | ICD-10-CM

## 2011-05-23 NOTE — Telephone Encounter (Signed)
OK re-entered order.

## 2011-05-23 NOTE — Telephone Encounter (Signed)
Sherry with [facility] called and needs you to re-enter the MR order. Needs to read MR Abdomen Complete with and without contrast

## 2011-05-25 ENCOUNTER — Telehealth: Payer: Self-pay | Admitting: *Deleted

## 2011-05-25 NOTE — Telephone Encounter (Signed)
Called and left PA number on christie's vm at [facility] [phone]. PA number is [phone]

## 2011-05-28 ENCOUNTER — Other Ambulatory Visit: Payer: BC Managed Care – PPO

## 2011-06-03 ENCOUNTER — Telehealth: Payer: Self-pay | Admitting: *Deleted

## 2011-06-03 MED ORDER — PANTOPRAZOLE SODIUM 40 MG PO TBEC: 40.0000 mg | DELAYED_RELEASE_TABLET | Freq: Every day | ORAL | Status: DC

## 2011-06-03 NOTE — Telephone Encounter (Signed)
Sounds good. He just make sure that the prescription was sent for the generic Protonix, pantoprazole.

## 2011-06-03 NOTE — Telephone Encounter (Signed)
Wife returns call and states they will try the Protonix for 1 month since insurance is requiring this first before approving the Dexilant. Please call into [facility] in [place].

## 2011-06-07 ENCOUNTER — Telehealth: Payer: Self-pay | Admitting: *Deleted

## 2011-06-07 NOTE — Telephone Encounter (Signed)
Wife informed

## 2011-06-07 NOTE — Telephone Encounter (Signed)
Pt's wife states they have been reading up on the fatty liver. They are asking if you think he should take L-Carnitine or use Thistle (some kind of milk). Please advise. States these are suppose to help when you have fatty liver.

## 2011-06-07 NOTE — Telephone Encounter (Signed)
He can take by the L- carnitine if he would like. I would not recommend milk thistle at all. Also the best treatment is avoidance of alcohol, regular exercise and a low-fat diet.  This will do more than any supplement well.

## 2011-06-11 ENCOUNTER — Ambulatory Visit
Admission: RE | Admit: 2011-06-11 | Discharge: 2011-06-11 | Disposition: A | Discharge status: Home / Self Care | Payer: BC Managed Care – PPO | Source: Ambulatory Visit | Attending: Family Medicine | Admitting: Family Medicine

## 2011-06-11 DIAGNOSIS — K769 Liver disease, unspecified: Secondary | ICD-10-CM

## 2011-06-11 MED ORDER — GADOBENATE DIMEGLUMINE 529 MG/ML IV SOLN
19.0000 mL | Freq: Once | INTRAVENOUS | Status: AC | PRN
  Administered 2011-06-11: 19 mL via INTRAVENOUS

## 2011-06-21 ENCOUNTER — Telehealth: Payer: Self-pay | Admitting: *Deleted

## 2011-06-21 NOTE — Telephone Encounter (Signed)
No liver supplement and no Gingko.  Agree with below.

## 2011-06-21 NOTE — Telephone Encounter (Addendum)
Pt's wife called again asking about pt's MRI results. I did go over results with her. I did mention to her that I had left a message on I assume her home number that pt's should call with questions. Went over results with her. Wife asked if pt can take a liver cleansing supplement from [facility] and Ginko Biloba. Advised that the best tx for fatty liver disease is for pt to stop drinking alcohol,and to diet and exercise and no supplement is going to take the place of those things. 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 Please advise about the liver cleansing supplement and ginko biloba

## 2011-06-22 NOTE — Telephone Encounter (Signed)
Called and left message on vm

## 2011-07-05 ENCOUNTER — Telehealth: Payer: Self-pay | Admitting: *Deleted

## 2011-07-05 MED ORDER — DEXLANSOPRAZOLE 60 MG PO CPDR: 60.0000 mg | DELAYED_RELEASE_CAPSULE | Freq: Every day | ORAL | Status: DC

## 2011-07-05 NOTE — Telephone Encounter (Signed)
LMOM

## 2011-07-05 NOTE — Telephone Encounter (Signed)
Pt has called stating that he has tried the Protonix for the last month along with diet changes but states that he is still having heartburn and a burning sensation that goes from his stomach up into his throat. Would like to know the next step that needs to be done to get back on the Dexilant.

## 2011-07-05 NOTE — Telephone Encounter (Signed)
I will send a new prescription for dexilant. This should trigger a prior off and we can complete it. Make sure avoiding drinking all alcohol and staying away from caffeinated beverages as well as carbonated beverages.

## 2012-02-23 ENCOUNTER — Ambulatory Visit (INDEPENDENT_AMBULATORY_CARE_PROVIDER_SITE_OTHER): Payer: BC Managed Care – PPO | Admitting: Family Medicine

## 2012-02-23 ENCOUNTER — Encounter: Payer: Self-pay | Admitting: Family Medicine

## 2012-02-23 VITALS — BP 115/70 | HR 60 | Ht 70.0 in | Wt 168.0 lb

## 2012-02-23 DIAGNOSIS — R932 Abnormal findings on diagnostic imaging of liver and biliary tract: Secondary | ICD-10-CM

## 2012-02-23 DIAGNOSIS — Z0000 Encounter for general adult medical examination without abnormal findings: Secondary | ICD-10-CM

## 2012-02-23 LAB — LIPID PANEL
Cholesterol: 218 mg/dL — ABNORMAL HIGH (ref 0–200)
HDL: 53 mg/dL (ref >39)
Total CHOL/HDL Ratio: 4.1 Ratio
Triglycerides: 105 mg/dL (ref <150)
VLDL: 21 mg/dL (ref 0–40)

## 2012-02-23 LAB — COMPLETE METABOLIC PANEL WITH GFR
Alkaline Phosphatase: 102 U/L (ref 39–117)
BUN: 11 mg/dL (ref 6–23)
GFR, Est African American: >89 mL/min
GFR, Est Non African American: >89 mL/min
Glucose, Bld: 102 mg/dL — ABNORMAL HIGH (ref 70–99)
Potassium: 4.6 mEq/L (ref 3.5–5.3)
Sodium: 140 mEq/L (ref 135–145)

## 2012-02-23 MED ORDER — TRIAMCINOLONE ACETONIDE 0.5 % EX OINT: TOPICAL_OINTMENT | Freq: Two times a day (BID) | CUTANEOUS | Status: DC

## 2012-02-23 NOTE — Patient Instructions (Signed)
Keep up a regular exercise program and make sure you are eating a healthy diet Try to eat 4 servings of dairy a day, or if you are lactose intolerant take a calcium with vitamin D daily.    

## 2012-02-23 NOTE — Progress Notes (Signed)
  Subjective:    Patient ID: Roy Mueller, male    DOB: 1965-12-15, 47 y.o.   MRN: [id number]  HPI Here for CPE today.  Hs from for work. Gets yearly eye exam.  Does wear lenses.  He has been exercising regularly and eating very healthy. He has lost a significant amount of weight intentionally.  Seeing [name] for a herniated disc in his low back.  He is planning on surgery. Has failed PT and injections. Likely schedule for next week.   Review of Systems     Objective:   Physical Exam  Constitutional: He is oriented to person, place, and time. He appears well-developed and well-nourished.  HENT:  Head: Normocephalic and atraumatic.  Right Ear: External ear normal.  Left Ear: External ear normal.  Nose: Nose normal.  Mouth/Throat: Oropharynx is clear and moist.  Eyes: Conjunctivae normal and EOM are normal. Pupils are equal, round, and reactive to light.  Neck: Normal range of motion. Neck supple. No thyromegaly present.  Cardiovascular: Normal rate, regular rhythm, normal heart sounds and intact distal pulses.        No carotid bruits.   Pulmonary/Chest: Effort normal and breath sounds normal.  Abdominal: Soft. Bowel sounds are normal. He exhibits no distension and no mass. There is no tenderness. There is no rebound and no guarding.  Musculoskeletal: Normal range of motion.  Lymphadenopathy:    He has no cervical adenopathy.  Neurological: He is alert and oriented to person, place, and time. He has normal reflexes.  Skin: Skin is warm and dry.  Psychiatric: He has a normal mood and affect. His behavior is normal. Judgment and thought content normal.          Assessment & Plan:  CPE Keep up a regular exercise program and make sure you are eating a healthy diet Try to eat 4 servings of dairy a day, or if you are lactose intolerant take a calcium with vitamin D daily.  Your vaccines are up to date.   He also needs a followup ultrasound of his liver. A year ago we saw  what looked like a hemangioma ultrasound. We followed up with an MRI. Did recommend a repeat ultrasound in one year to confirm no change in size. It is really made the changes in his diet and has lost weight. We will schedule the ultrasound.  Low back pain-herniated disc. He's planning on surgery in the next couple weeks with [name].

## 2012-02-24 LAB — TESTOSTERONE, FREE, TOTAL, SHBG
Sex Hormone Binding: 61 nmol/L (ref 13–71)
Testosterone-% Free: 1.4 % — ABNORMAL LOW (ref 1.6–2.9)
Testosterone: 668 ng/dL (ref 300–890)

## 2012-02-27 ENCOUNTER — Other Ambulatory Visit: Payer: Self-pay | Admitting: *Deleted

## 2012-03-08 ENCOUNTER — Encounter: Payer: BC Managed Care – PPO | Admitting: Family Medicine

## 2012-03-16 ENCOUNTER — Ambulatory Visit (INDEPENDENT_AMBULATORY_CARE_PROVIDER_SITE_OTHER): Payer: BC Managed Care – PPO

## 2012-03-16 DIAGNOSIS — K769 Liver disease, unspecified: Secondary | ICD-10-CM

## 2012-03-16 DIAGNOSIS — R932 Abnormal findings on diagnostic imaging of liver and biliary tract: Secondary | ICD-10-CM

## 2012-03-16 DIAGNOSIS — Z0389 Encounter for observation for other suspected diseases and conditions ruled out: Secondary | ICD-10-CM

## 2012-11-22 ENCOUNTER — Other Ambulatory Visit: Payer: Self-pay

## 2012-11-25 ENCOUNTER — Other Ambulatory Visit: Payer: Self-pay | Admitting: Family Medicine

## 2013-02-26 ENCOUNTER — Other Ambulatory Visit: Payer: Self-pay | Admitting: Family Medicine

## 2013-03-04 ENCOUNTER — Encounter: Payer: BC Managed Care – PPO | Admitting: Family Medicine

## 2013-03-05 ENCOUNTER — Encounter: Payer: BC Managed Care – PPO | Admitting: Family Medicine

## 2013-03-07 ENCOUNTER — Encounter: Payer: BC Managed Care – PPO | Admitting: Family Medicine

## 2013-03-12 ENCOUNTER — Encounter: Payer: BC Managed Care – PPO | Admitting: Family Medicine

## 2013-04-01 ENCOUNTER — Ambulatory Visit (INDEPENDENT_AMBULATORY_CARE_PROVIDER_SITE_OTHER): Payer: BC Managed Care – PPO | Admitting: Family Medicine

## 2013-04-01 ENCOUNTER — Encounter: Payer: Self-pay | Admitting: Family Medicine

## 2013-04-01 VITALS — BP 118/70 | HR 55 | Ht 70.0 in | Wt 176.0 lb

## 2013-04-01 DIAGNOSIS — R350 Frequency of micturition: Secondary | ICD-10-CM

## 2013-04-01 DIAGNOSIS — Z0000 Encounter for general adult medical examination without abnormal findings: Secondary | ICD-10-CM

## 2013-04-01 DIAGNOSIS — D1803 Hemangioma of intra-abdominal structures: Secondary | ICD-10-CM

## 2013-04-01 DIAGNOSIS — R3912 Poor urinary stream: Secondary | ICD-10-CM

## 2013-04-01 DIAGNOSIS — E559 Vitamin D deficiency, unspecified: Secondary | ICD-10-CM

## 2013-04-01 DIAGNOSIS — R39198 Other difficulties with micturition: Secondary | ICD-10-CM

## 2013-04-01 LAB — POCT URINALYSIS DIPSTICK
Bilirubin, UA: NEGATIVE
GLUCOSE UA: NEGATIVE
Ketones, UA: NEGATIVE
Leukocytes, UA: NEGATIVE
Nitrite, UA: NEGATIVE
PROTEIN UA: NEGATIVE
RBC UA: NEGATIVE
Spec Grav, UA: <=1.005
UROBILINOGEN UA: 0.2
pH, UA: 6

## 2013-04-01 NOTE — Patient Instructions (Signed)
Keep up a regular exercise program and make sure you are eating a healthy diet Try to eat 4 servings of dairy a day, or if you are lactose intolerant take a calcium with vitamin D daily.  Your vaccines are up to date.   

## 2013-04-01 NOTE — Progress Notes (Signed)
Subjective:    Patient ID: Roy Mueller, male    DOB: 1965-04-22, 48 y.o.   MRN: [id number]  HPI Here for complete physical today.  Has been working out regularly.  Would like to have his testosteron checked as well. Has had low Vitamin D in the past.   He notes he is still having some urinary symptoms. He's going frequently that he says he does drink about a gallon of water a day. He's noticed a decrease in the power of his urinary stream. Sometimes he'll have to go as quickly as 10 minutes after just having gone to the bathroom. He does feel like he empties completely. He is able to sleep about 5 hours a night before he has to wake up to urinate.  Review of Systems Comprehensive review of systems is negative.  BP 118/70  Pulse 55  Ht 5\' 10"  (1.778 m)  Wt 176 lb (79.833 kg)  BMI 25.25 kg/m2  SpO2 96%    No Known Allergies  Past Medical History  Diagnosis Date  . GERD (gastroesophageal reflux disease)     Past Surgical History  Procedure Laterality Date  . C5-7 cervical diskectomy  2011    and fusion with Crystal Peek spacer    History   Social History  . Marital Status: Married    Spouse Name: [name]    Number of Children: 2   . Years of Education: N/A   Occupational History  . Clinical biochemist.      [employer]   Social History Main Topics  . Smoking status: Former Smoker -- 0.25 packs/day for 3 years    Types: Cigarettes    Quit date: 01/01/2013  . Smokeless tobacco: Not on file  . Alcohol Use: 1.2 oz/week    2 Cans of beer per week  . Drug Use: Yes  . Sexual Activity: Yes    Partners: Female   Other Topics Concern  . Not on file   Social History Narrative   + regular exercise.      Family History  Problem Relation Age of Onset  . Heart attack Paternal Uncle   . Heart attack Paternal Uncle     Outpatient Encounter Prescriptions as of 04/01/2013  Medication Sig  . Cyanocobalamin (VITAMIN B-12 PO) Take by mouth.  . DEXILANT 60 MG capsule  TAKE 1 CAPSULE BY MOUTH EVERY DAY  . triamcinolone ointment (KENALOG) 0.5 % APPLY TOPICALLY TWICE DAILY          Objective:   Physical Exam  Constitutional: He is oriented to person, place, and time. He appears well-developed and well-nourished.  HENT:  Head: Normocephalic and atraumatic.  Right Ear: External ear normal.  Left Ear: External ear normal.  Nose: Nose normal.  Mouth/Throat: Oropharynx is clear and moist.  Eyes: Conjunctivae and EOM are normal. Pupils are equal, round, and reactive to light.  Neck: Normal range of motion. Neck supple. No thyromegaly present.  Cardiovascular: Normal rate, regular rhythm, normal heart sounds and intact distal pulses.   Pulmonary/Chest: Effort normal and breath sounds normal.  Abdominal: Soft. Bowel sounds are normal. He exhibits no distension and no mass. There is no tenderness. There is no rebound and no guarding.  Musculoskeletal: Normal range of motion.  Lymphadenopathy:    He has no cervical adenopathy.  Neurological: He is alert and oriented to person, place, and time. He has normal reflexes.  Skin: Skin is warm and dry.  Psychiatric: He has a normal mood and affect. His  behavior is normal. Judgment and thought content normal.          Assessment & Plan:  CPE Keep up a regular exercise program and make sure you are eating a healthy diet Try to eat 4 servings of dairy a day, or if you are lactose intolerant take a calcium with vitamin D daily.  Your vaccines are up to date.   Hyperlipidemia - Due to recheck lipids.    Urinary frequency with decreased stream-AUA score of 12, moderate.  Rated QOL at mixed (score3) We'll perform urinalysis as well. He declined rectal exam to evaluate the prostate today. Certainly his symptoms are significant. Will check PSA on bloodwork. If it's normal then would like to discuss further possible treatment options and then recommend formal exam to evaluate for enlargement of the prostate gland.

## 2013-04-05 ENCOUNTER — Telehealth: Payer: Self-pay | Admitting: Family Medicine

## 2013-04-05 ENCOUNTER — Other Ambulatory Visit: Payer: Self-pay | Admitting: Family Medicine

## 2013-04-05 DIAGNOSIS — K7689 Other specified diseases of liver: Secondary | ICD-10-CM

## 2013-04-05 LAB — COMPLETE METABOLIC PANEL WITH GFR
ALBUMIN: 4.3 g/dL (ref 3.5–5.2)
ALT: 15 U/L (ref 0–53)
AST: 17 U/L (ref 0–37)
Alkaline Phosphatase: 90 U/L (ref 39–117)
BUN: 13 mg/dL (ref 6–23)
CALCIUM: 9.3 mg/dL (ref 8.4–10.5)
CHLORIDE: 105 meq/L (ref 96–112)
CO2: 27 mEq/L (ref 19–32)
Creat: 0.93 mg/dL (ref 0.50–1.35)
GLUCOSE: 104 mg/dL — AB (ref 70–99)
Potassium: 5.2 mEq/L (ref 3.5–5.3)
SODIUM: 139 meq/L (ref 135–145)
TOTAL PROTEIN: 6.9 g/dL (ref 6.0–8.3)
Total Bilirubin: 0.7 mg/dL (ref 0.2–1.2)

## 2013-04-05 LAB — LIPID PANEL
Cholesterol: 165 mg/dL (ref 0–200)
HDL: 42 mg/dL (ref >39)
LDL CALC: 103 mg/dL — AB (ref 0–99)
Total CHOL/HDL Ratio: 3.9 Ratio
Triglycerides: 100 mg/dL (ref <150)
VLDL: 20 mg/dL (ref 0–40)

## 2013-04-05 NOTE — Telephone Encounter (Signed)
Patient advised that he would like an order to have his liver check'd, pt states like a sonogram?. Thanks

## 2013-04-05 NOTE — Telephone Encounter (Signed)
Call pt: I would like him to schedule followup office visit to discuss his prostate. Based on the way he completed the questionnaire I would like to discuss possible treatment options for his prostate. If he would like some additional information he can also go onto the website [email or web address] He can just type in BPH.

## 2013-04-05 NOTE — Telephone Encounter (Signed)
Called and informed pt of recommendations. He will call back and schedule an appt.Roy Mueller [place]

## 2013-04-05 NOTE — Telephone Encounter (Signed)
Ordered US of liver to f/u cyst.

## 2013-04-06 LAB — VITAMIN D 25 HYDROXY (VIT D DEFICIENCY, FRACTURES): VIT D 25 HYDROXY: 71 ng/mL (ref 30–89)

## 2013-04-06 LAB — PSA: PSA: 0.35 ng/mL (ref <=4.00)

## 2013-04-08 ENCOUNTER — Ambulatory Visit: Payer: BC Managed Care – PPO

## 2013-04-08 LAB — TESTOSTERONE, FREE, TOTAL, SHBG
SEX HORMONE BINDING: 66 nmol/L (ref 13–71)
TESTOSTERONE-% FREE: 1.3 % — AB (ref 1.6–2.9)
Testosterone, Free: 74.4 pg/mL (ref 47.0–244.0)
Testosterone: 565 ng/dL (ref 300–890)

## 2013-04-09 ENCOUNTER — Telehealth: Payer: Self-pay | Admitting: *Deleted

## 2013-04-09 NOTE — Telephone Encounter (Signed)
Screening form completed and faxed, and scanned.Roy Mueller [place]

## 2013-04-15 ENCOUNTER — Ambulatory Visit (INDEPENDENT_AMBULATORY_CARE_PROVIDER_SITE_OTHER): Payer: BC Managed Care – PPO | Admitting: Family Medicine

## 2013-04-15 ENCOUNTER — Encounter: Payer: Self-pay | Admitting: Family Medicine

## 2013-04-15 ENCOUNTER — Ambulatory Visit (INDEPENDENT_AMBULATORY_CARE_PROVIDER_SITE_OTHER): Payer: BC Managed Care – PPO

## 2013-04-15 VITALS — BP 121/73 | HR 64 | Wt 171.0 lb

## 2013-04-15 DIAGNOSIS — N400 Enlarged prostate without lower urinary tract symptoms: Secondary | ICD-10-CM

## 2013-04-15 DIAGNOSIS — K824 Cholesterolosis of gallbladder: Secondary | ICD-10-CM

## 2013-04-15 DIAGNOSIS — K7689 Other specified diseases of liver: Secondary | ICD-10-CM

## 2013-04-15 MED ORDER — TERAZOSIN HCL 1 MG PO CAPS: 1.0000 mg | ORAL_CAPSULE | Freq: Every day | ORAL | Status: DC

## 2013-04-15 NOTE — Progress Notes (Signed)
   Subjective:    Patient ID: Roy Mueller, male    DOB: March 29, 1965, 48 y.o.   MRN: [id number]  HPI Here to discuss BPH.  His AUA score of 12.  See score sheet form last office visit. No family history of prostate problems. PSA was normal.  Lab Results  Component Value Date   PSA 0.35 04/05/2013   PSA 0.23 05/13/2011   PSA 0.26 04/01/2011    Review of Systems     Objective:   Physical Exam  Constitutional: He is oriented to person, place, and time. He appears well-developed and well-nourished.  HENT:  Head: Normocephalic and atraumatic.  Genitourinary:  He declined prostate exam today.  Neurological: He is alert and oriented to person, place, and time.  Skin: Skin is warm.  Psychiatric: He has a normal mood and affect. His behavior is normal.          Assessment & Plan:  BPH -did not perform exam today to confirm enlarged prostate that he certainly has significant symptoms that I think we should consider treatment. we discussed treatment options including alpha blockers. This is typically first line for moderate symptoms. Will start terazosin 1 mg at bedtime. Warned about potential side effects including hypotension, lightheadedness, dizziness, nasal congestion. We could also consider Cialis as an option and discussed this as well. I would like to see him back in 2 months to make sure that he is tolerating the medication well and to adjust it if needed. We can also add a 5 out for Dr. ACE inhibitor if needed for better control of symptoms.  Time spent 20 min, >50% of the time counseling about BPH and treatment options.

## 2013-07-15 ENCOUNTER — Telehealth: Payer: Self-pay | Admitting: *Deleted

## 2013-07-15 NOTE — Telephone Encounter (Signed)
Pt's wife called and wanted to know if it would be ok for him to take any of the following; cod liver oil supplements, omega 3,and if ok to eat avocados? Also wanted to know if he should continue taking terazosin? I informed her that this was given to him for his BPH symptoms however, I will forward her questions to Dr. Madilyn Fireman and she was told that Dr. Madilyn Fireman is not here this week I asked if she needed a response soon she replied that she did not.Roy Mueller [place]

## 2013-07-18 NOTE — Telephone Encounter (Signed)
OK for avaocados.  Don't recommend liver oil supplement or omega 3. This mostly helps lower TG and his TG in the last year look great! Needs to make appt to f/u on terazosin. He was supposed to f/u last month on this medication.

## 2013-07-22 NOTE — Telephone Encounter (Signed)
Called and informed pt's wife of recommendations and that he will need to continue meds until he f/u with Dr. Madilyn Fireman and they can discuss at that time.Roy Mueller

## 2013-11-26 ENCOUNTER — Telehealth: Payer: Self-pay | Admitting: *Deleted

## 2013-11-26 NOTE — Telephone Encounter (Signed)
Pt's wife called about her husbands hand. They are in [place] right now and his R hand has been hurting, no swelling, x 1 month he works with his hands and wanted to know if Dr. Madilyn Fireman would order an xray of his hand and fax it to a facility there to have this done. I informed her that I wasn't sure if she would but I would forward this to her for f/u and call her back with her answer.Audelia Hives [place]

## 2013-11-27 NOTE — Telephone Encounter (Signed)
Where exactly is his pain. Is there a certain finger involved, etc?  Has he tried NSAID like aleve or IBU? Was there any trauam?

## 2013-11-29 NOTE — Telephone Encounter (Signed)
Right hand index finger. No trauma, or injury. He has been taking ibu. 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

## 2013-12-01 NOTE — Telephone Encounter (Signed)
If he is going to be there for awhile then ok to fax order for hand xray of right hand.

## 2013-12-02 NOTE — Telephone Encounter (Addendum)
Called and informed pt's wife of Dr. Gardiner Ramus recommendations she will call back with facility and fax #.Roy Mueller [place]

## 2014-01-24 ENCOUNTER — Telehealth: Payer: Self-pay

## 2014-01-24 DIAGNOSIS — M79641 Pain in right hand: Secondary | ICD-10-CM

## 2014-01-24 NOTE — Telephone Encounter (Signed)
Patient's wife called and states she needs an order for hand x-ray sent to [facility] Address: [address], [place] [address] [phone] Fax: [phone]

## 2014-01-26 NOTE — Telephone Encounter (Signed)
[place] for [facility] hand complete. Not sure if right or left.

## 2014-01-27 NOTE — Telephone Encounter (Signed)
Faxed order

## 2014-01-27 NOTE — Telephone Encounter (Signed)
lvm informing pt's wife that order has been faxed to [facility] [phone].Audelia Hives [place]

## 2014-02-12 ENCOUNTER — Telehealth: Payer: Self-pay | Admitting: Family Medicine

## 2014-02-12 NOTE — Telephone Encounter (Signed)
Left detailed vm with results. Pt advised to call back w/any questions.Roy Mueller

## 2014-02-12 NOTE — Telephone Encounter (Signed)
Please call patient: I did get his radiology report on his hand x-ray. His complaint was pain and stiffness in the second digit. There was no evidence of fracture or dislocation. There was some curvature of the fifth digit related to an old injury but otherwise nothing to suggest any underlying complications with the second digit. The pain and stiffness may be coming more from the tendons which do not show up on x-ray.

## 2014-02-12 NOTE — Telephone Encounter (Signed)
Pt's wife informed she wanted to know if Dr. Madilyn Fireman noticed any arthritis on the actual cd that was sent?..Roy Mueller

## 2014-02-14 NOTE — Telephone Encounter (Signed)
I did not see anything abnormal on the images. I looked at those on the CD that was provided. The second finger looks completely normal.

## 2014-02-14 NOTE — Telephone Encounter (Signed)
Pt's wife informed. She stated that they have made an appt w/a hand specialist.Larrisha Babineau, [place]

## 2014-02-18 ENCOUNTER — Encounter: Payer: Self-pay | Admitting: Family Medicine

## 2014-03-18 ENCOUNTER — Telehealth: Payer: Self-pay | Admitting: *Deleted

## 2014-03-18 NOTE — Telephone Encounter (Signed)
Pt's husband called and requested a copy of radiology report of his hand to be faxed.Roy Mueller [place]

## 2014-07-05 DIAGNOSIS — M4722 Other spondylosis with radiculopathy, cervical region: Secondary | ICD-10-CM | POA: Insufficient documentation

## 2015-01-20 IMAGING — US US ABDOMEN LIMITED
1 series · 14 of 25 positions shown · non-contrast
Comparison: US ABDOMEN COMPLETE dated 03/16/2012; MR ABDOMEN WO/W CM
dated 06/11/2011

CLINICAL DATA: Surveillance evaluation of a right liver lesion.

EXAM:
US ABDOMEN LIMITED - RIGHT UPPER QUADRANT

[Series 1: us abdomen limited · 0.26mm/px · 14 of 58 slices shown]
[im 1/58]
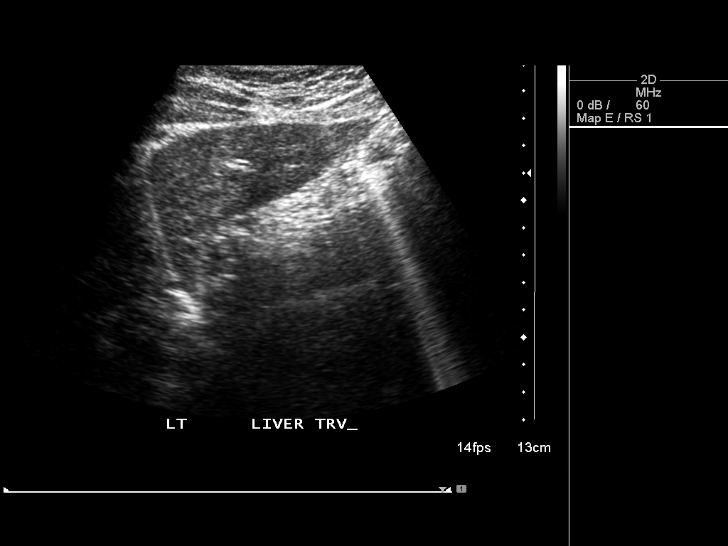
[im 5/58]
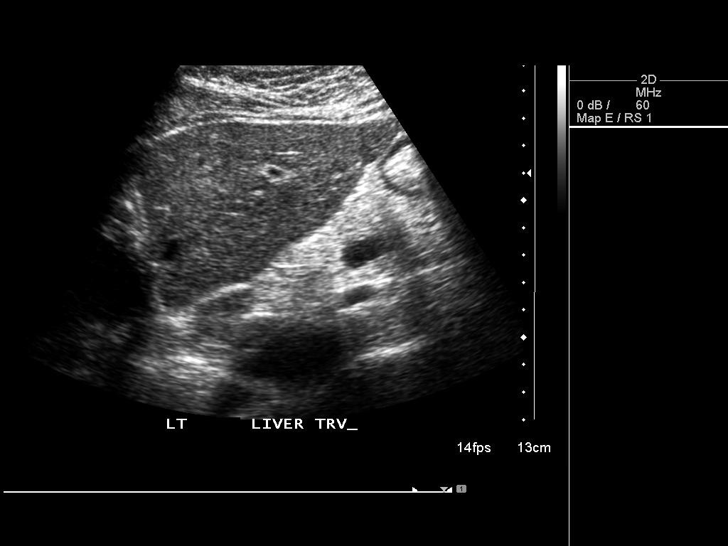
[im 10/58]
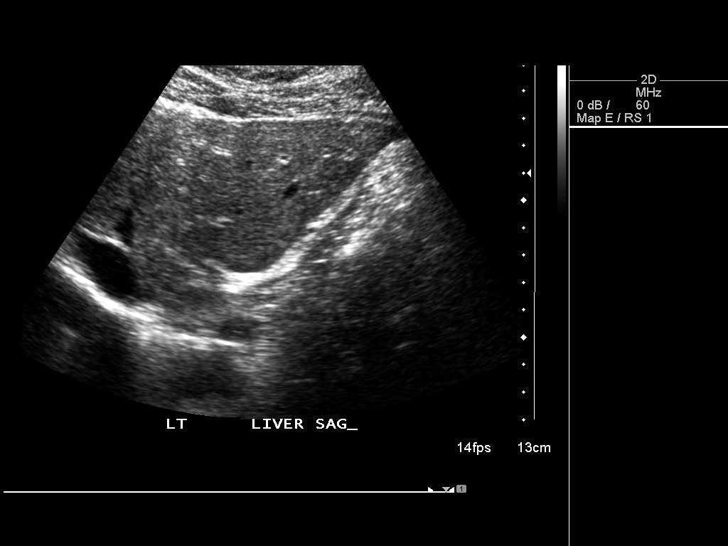
[im 15/58]
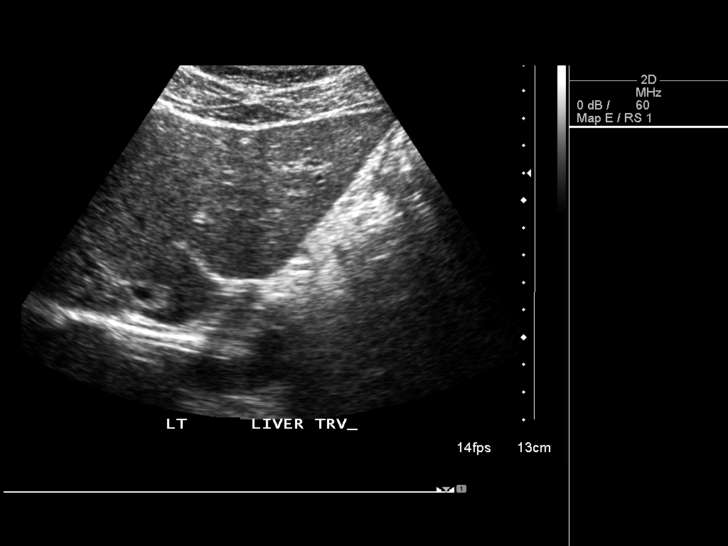
[im 20/58]
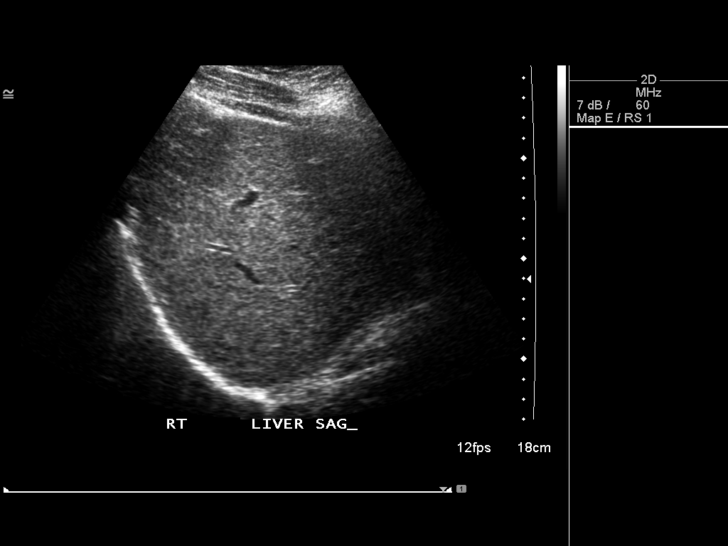
[im 22/58]
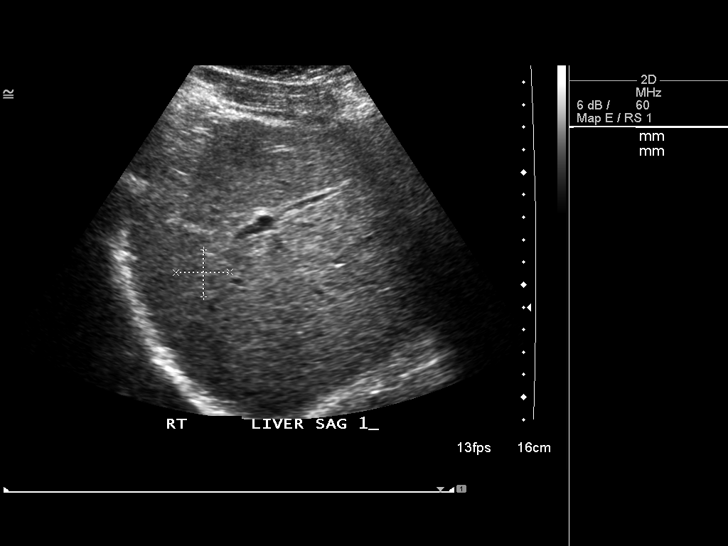
[im 27/58]
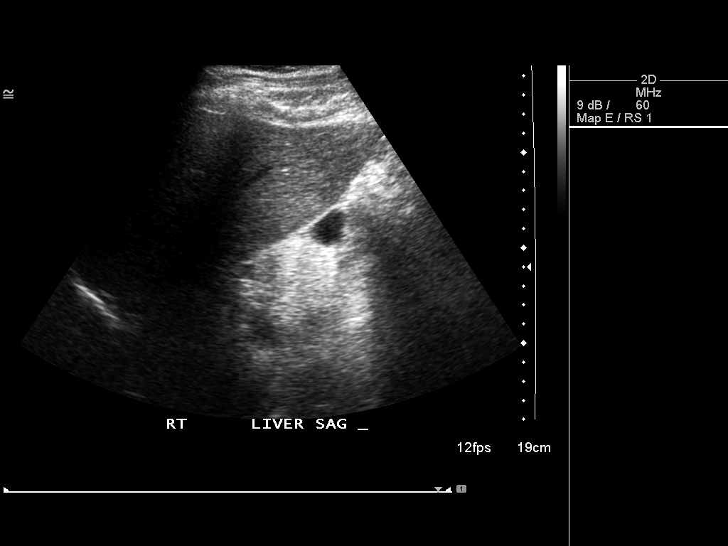
[im 31/58]
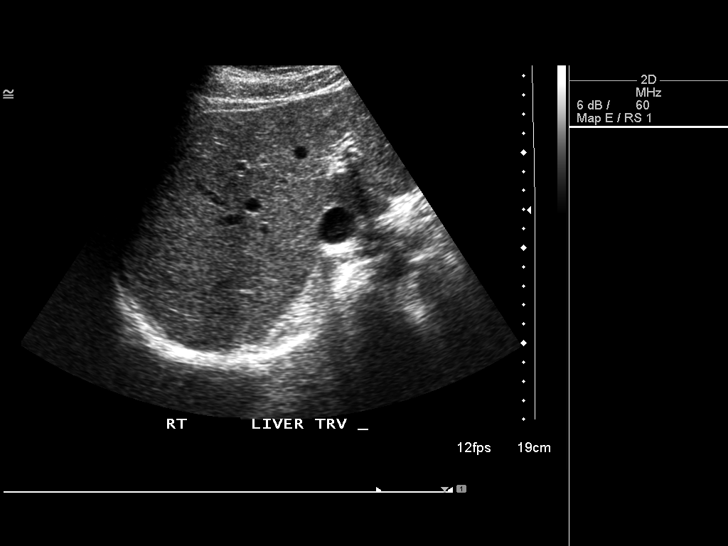
[im 36/58]
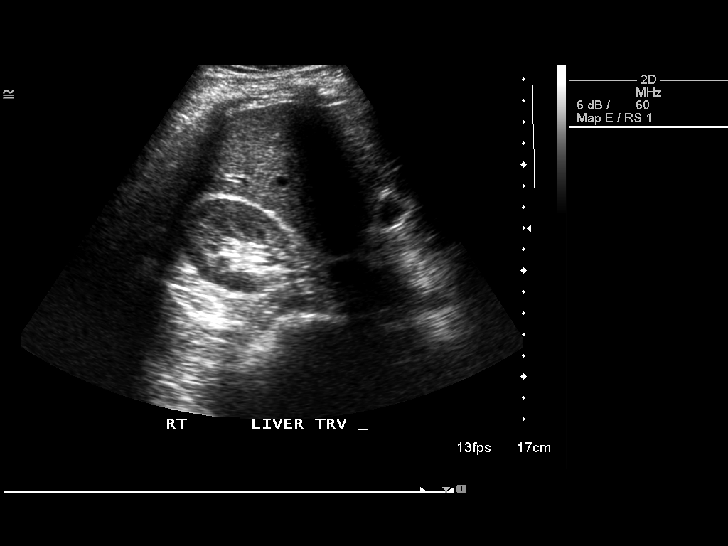
[im 39/58]
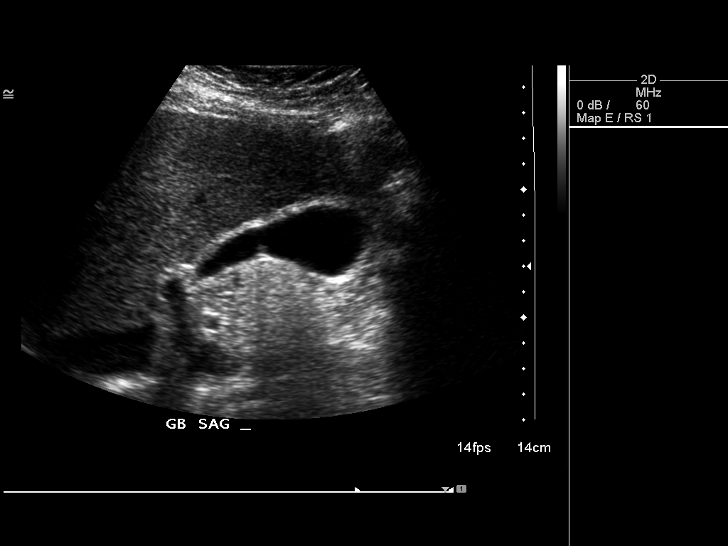
[im 43/58]
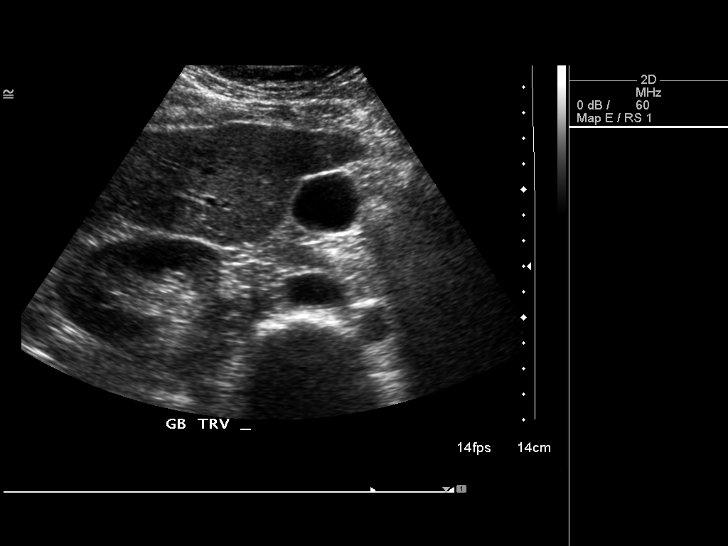
[im 48/58]
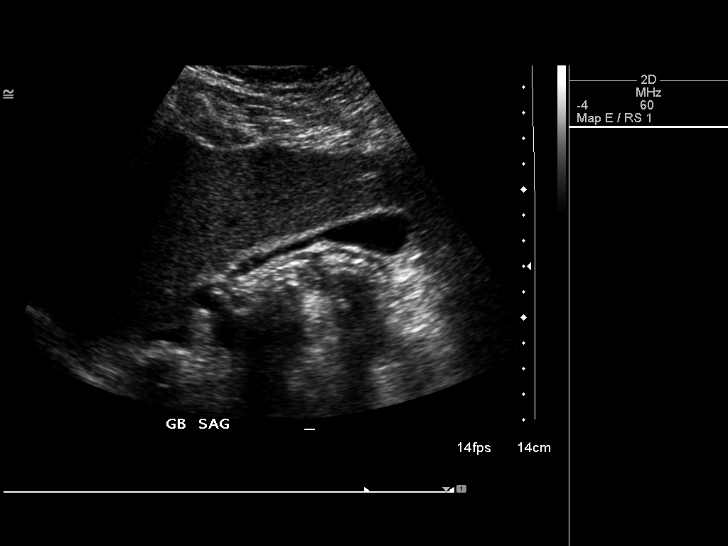
[im 53/58]
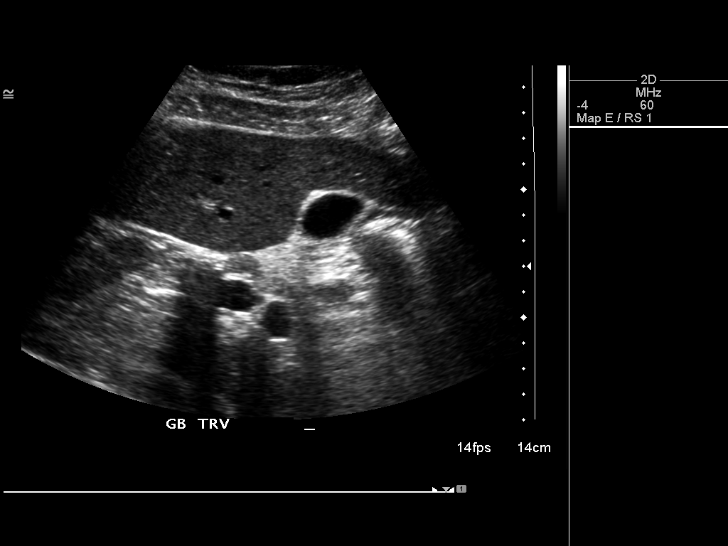
[im 58/58]
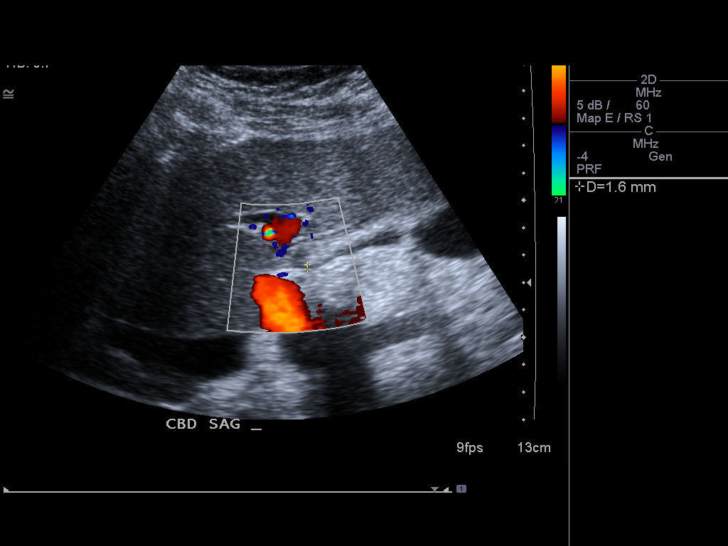

[14 of 25 positions shown; findings below may reference images not displayed]

FINDINGS: Gallbladder:

Small 4 mm polyp within the gallbladder otherwise no wall thickening
visualized, measuring 2.1 mm. No sonographic Murphy sign noted.

Common bile duct:

Diameter: 1.6 mm

Liver:

Stable 2 cm echogenic nodule within the right lobe of the liver.
Liver parenchyma otherwise unremarkable.
IMPRESSION: 1. Stable 2 cm liver nodule consistent with hemangioma described on
previous imaging.
2. Small gallbladder polyp otherwise unremarkable ultrasound.

## 2016-01-18 HISTORY — PX: CHOLECYSTECTOMY, LAPAROSCOPIC: SHX56

## 2016-11-29 ENCOUNTER — Encounter: Payer: Self-pay | Admitting: Family Medicine

## 2016-11-29 ENCOUNTER — Ambulatory Visit (INDEPENDENT_AMBULATORY_CARE_PROVIDER_SITE_OTHER): Payer: BLUE CROSS/BLUE SHIELD | Admitting: Family Medicine

## 2016-11-29 DIAGNOSIS — R1011 Right upper quadrant pain: Secondary | ICD-10-CM

## 2016-11-29 DIAGNOSIS — R1031 Right lower quadrant pain: Secondary | ICD-10-CM

## 2016-11-29 DIAGNOSIS — E041 Nontoxic single thyroid nodule: Secondary | ICD-10-CM | POA: Diagnosis not present

## 2016-11-29 LAB — COMPLETE METABOLIC PANEL WITH GFR
AG Ratio: 1.6 (calc) (ref 1.0–2.5)
ALBUMIN MSPROF: 4.2 g/dL (ref 3.6–5.1)
ALT: 21 U/L (ref 9–46)
AST: 22 U/L (ref 10–40)
Alkaline phosphatase (APISO): 80 U/L (ref 40–115)
BUN: 17 mg/dL (ref 7–25)
CHLORIDE: 100 mmol/L (ref 98–110)
CO2: 31 mmol/L (ref 20–32)
Calcium: 9.2 mg/dL (ref 8.6–10.3)
Creat: 0.85 mg/dL (ref 0.60–1.35)
GFR, Est African American: 119 mL/min/{1.73_m2} (ref >=60)
GFR, Est Non African American: 102 mL/min/{1.73_m2} (ref >=60)
Globulin: 2.6 g/dL (calc) (ref 1.9–3.7)
Glucose, Bld: 74 mg/dL (ref 65–99)
Potassium: 3.9 mmol/L (ref 3.5–5.3)
Sodium: 138 mmol/L (ref 135–146)
Total Bilirubin: 0.8 mg/dL (ref 0.2–1.2)
Total Protein: 6.8 g/dL (ref 6.1–8.1)

## 2016-11-29 LAB — CBC WITH DIFFERENTIAL/PLATELET
BASOS ABS: 18 {cells}/uL (ref 0–200)
Basophils Relative: 0.2 %
Eosinophils Absolute: 18 cells/uL (ref 15–500)
Eosinophils Relative: 0.2 %
HCT: 44.1 % (ref 38.5–50.0)
HEMOGLOBIN: 15 g/dL (ref 13.2–17.1)
Lymphs Abs: 2670 cells/uL (ref 850–3900)
MCH: 30.2 pg (ref 27.0–33.0)
MCHC: 34 g/dL (ref 32.0–36.0)
MCV: 88.7 fL (ref 80.0–100.0)
MPV: 8.9 fL (ref 7.5–12.5)
Monocytes Relative: 6.5 %
NEUTROS ABS: 5616 {cells}/uL (ref 1500–7800)
Neutrophils Relative %: 63.1 %
Platelets: 322 10*3/uL (ref 140–400)
RBC: 4.97 10*6/uL (ref 4.20–5.80)
RDW: 11.7 % (ref 11.0–15.0)
Total Lymphocyte: 30 %
WBC mixed population: 579 cells/uL (ref 200–950)
WBC: 8.9 10*3/uL (ref 3.8–10.8)

## 2016-11-29 NOTE — Progress Notes (Signed)
Subjective:    Patient ID: Roy Mueller, male    DOB: 1965/11/23, 51 y.o.   MRN: [id number]  HPI 51 year old male comes in today complaining of right-sided abdominal pain.  He was having similar symptoms that would radiate from the right upper quadrant down to the right lower quadrant.  This went on for a couple of months and after consultation with a primary care provider and specialist they recommended cholecyst ectomy.  On the initial ultrasound that did not see a gallstone but then when he had a CT it did show 1.  When they went in to remove the gallbladder they just saw sludge and not a stone.  He was doing well initially and has been trying to be careful about his diet but more recently has been started having right-sided abdominal pain again.  He denies any change in bowels.  He is actually been taking Metamucil daily to help move his bowels through.  He denies any fevers chills or sweats or sign of other type of bacterial infection.  He has been going to the gym and exercising but this does not seem to aggravate his symptoms and they seem to be random.  He has pain at some point every single day.  He also wanted to give me an update that he does have a thyroid nodule.  They have been following it yearly with a TSH.   Review of Systems  There were no vitals taken for this visit.    No Known Allergies  Past Medical History:  Diagnosis Date  . GERD (gastroesophageal reflux disease)     Past Surgical History:  Procedure Laterality Date  . c5-7 cervical diskectomy  2011   and fusion with Crystal Peek spacer    Social History   Socioeconomic History  . Marital status: Married    Spouse name: [name]  . Number of children: 2   . Years of education: Not on file  . Highest education level: Not on file  Social Needs  . Financial resource strain: Not on file  . Food insecurity - worry: Not on file  . Food insecurity - inability: Not on file  . Transportation needs - medical:  Not on file  . Transportation needs - non-medical: Not on file  Occupational History  . Occupation: Clinical biochemist.     Employer: [employer]    Comment: Technical brewer  Tobacco Use  . Smoking status: Former Smoker    Packs/day: 0.25    Years: 3.00    Pack years: 0.75    Types: Cigarettes    Last attempt to quit: 01/01/2013    Years since quitting: 3.9  Substance and Sexual Activity  . Alcohol use: Yes    Alcohol/week: 1.2 oz    Types: 2 Cans of beer per week  . Drug use: Yes  . Sexual activity: Yes    Partners: Female  Other Topics Concern  . Not on file  Social History Narrative   + regular exercise.      Family History  Problem Relation Age of Onset  . Heart attack Paternal Uncle   . Heart attack Paternal Uncle     Outpatient Encounter Medications as of 11/29/2016  Medication Sig  . HYDROcodone-acetaminophen (NORCO) 10-325 MG per tablet as needed.   . [DISCONTINUED] Cyanocobalamin (VITAMIN B-12 PO) Take by mouth.  . [DISCONTINUED] DEXILANT 60 MG capsule TAKE 1 CAPSULE BY MOUTH EVERY DAY  . [DISCONTINUED] terazosin (HYTRIN) 1 MG capsule Take 1 capsule (1  mg total) by mouth at bedtime.  . [DISCONTINUED] triamcinolone ointment (KENALOG) 0.5 % APPLY TOPICALLY TWICE DAILY   No facility-administered encounter medications on file as of 11/29/2016.          Objective:   Physical Exam  Constitutional: He is oriented to person, place, and time. He appears well-developed and well-nourished.  HENT:  Head: Normocephalic and atraumatic.  Cardiovascular: Normal rate, regular rhythm and normal heart sounds.  Pulmonary/Chest: Effort normal and breath sounds normal.  Abdominal: Soft. Bowel sounds are normal. He exhibits no distension. There is no tenderness. There is no rebound and no guarding.  Musculoskeletal:  No pain in the right hip with range of motion.  Strength in the right hip is 5 out of 5.  Neurological: He is alert and oriented to person, place,  and time.  Skin: Skin is warm and dry.  Psychiatric: He has a normal mood and affect. His behavior is normal.       Assessment & Plan:  Right sided abdominal pain -unclear etiology.  But he is having daily persistent pain.  It is very possible that this was not actually has gallbladder since the pain has now recurred.  Plan to repeat CT with contrast for further evaluation.  He is not having any change in bowels or febrile symptoms.  We will also check liver enzymes as well as a CBC.  Thyroid nodule-monitor yearly.  We will add to problem list.

## 2016-11-30 ENCOUNTER — Ambulatory Visit (INDEPENDENT_AMBULATORY_CARE_PROVIDER_SITE_OTHER): Payer: BLUE CROSS/BLUE SHIELD

## 2016-11-30 DIAGNOSIS — D1803 Hemangioma of intra-abdominal structures: Secondary | ICD-10-CM | POA: Diagnosis not present

## 2016-11-30 DIAGNOSIS — Z9049 Acquired absence of other specified parts of digestive tract: Secondary | ICD-10-CM

## 2016-11-30 DIAGNOSIS — R1031 Right lower quadrant pain: Secondary | ICD-10-CM

## 2016-11-30 DIAGNOSIS — R1011 Right upper quadrant pain: Secondary | ICD-10-CM

## 2016-11-30 MED ORDER — IOPAMIDOL (ISOVUE-300) INJECTION 61%
100.0000 mL | Freq: Once | INTRAVENOUS | Status: AC | PRN
  Administered 2016-11-30: 100 mL via INTRAVENOUS

## 2016-12-01 ENCOUNTER — Ambulatory Visit: Payer: Self-pay | Admitting: Family Medicine

## 2017-08-08 ENCOUNTER — Emergency Department (INDEPENDENT_AMBULATORY_CARE_PROVIDER_SITE_OTHER)
Admission: EM | Admit: 2017-08-08 | Discharge: 2017-08-08 | Disposition: A | Discharge status: Home / Self Care | Payer: 59 | Source: Home / Self Care | Attending: Family Medicine | Admitting: Family Medicine

## 2017-08-08 ENCOUNTER — Encounter: Payer: Self-pay | Admitting: *Deleted

## 2017-08-08 ENCOUNTER — Other Ambulatory Visit: Payer: Self-pay

## 2017-08-08 DIAGNOSIS — L255 Unspecified contact dermatitis due to plants, except food: Secondary | ICD-10-CM | POA: Diagnosis not present

## 2017-08-08 MED ORDER — TRIAMCINOLONE ACETONIDE 0.1 % EX CREA: 1.0000 "application " | TOPICAL_CREAM | Freq: Two times a day (BID) | CUTANEOUS | 0 refills | Status: DC

## 2017-08-08 MED ORDER — PREDNISONE 20 MG PO TABS: ORAL_TABLET | ORAL | 0 refills | Status: DC

## 2017-08-08 MED ORDER — METHYLPREDNISOLONE ACETATE 80 MG/ML IJ SUSP
80.0000 mg | Freq: Once | INTRAMUSCULAR | Status: AC
  Administered 2017-08-08: 80 mg via INTRAMUSCULAR

## 2017-08-08 NOTE — ED Triage Notes (Signed)
Pt c/o rash all over x 3 days.

## 2017-08-08 NOTE — ED Provider Notes (Signed)
[facility]    CSN: [id number] Arrival date & time: 08/08/17  1051     History   Chief Complaint Chief Complaint  Patient presents with  . Rash    HPI Roy Mueller is a 52 y.o. male.   HPI  Roy Mueller is a 52 y.o. male presenting to UC with c/o itchy red rash all over his arms, legs and abdomen that started 3 days ago while working outside. He is concerned he its from poison ivy. He tried OTC antiitch cream with minimal relief. Denies pain on the rash, just itching. No new soaps, lotions or detergents.    Past Medical History:  Diagnosis Date  . GERD (gastroesophageal reflux disease)     Patient Active Problem List   Diagnosis Date Noted  . Thyroid nodule 11/29/2016  . Liver hemangioma 04/01/2013  . Other and unspecified hyperlipidemia 04/01/2011  . Eczema 04/01/2011  . NECK PAIN 09/04/2009  . GERD 03/25/2009    Past Surgical History:  Procedure Laterality Date  . BACK SURGERY    . c5-7 cervical diskectomy  2011   and fusion with Crystal Peek spacer  . CHOLECYSTECTOMY, LAPAROSCOPIC  2018       Home Medications    Prior to Admission medications   Medication Sig Start Date End Date Taking? Authorizing Provider  predniSONE (DELTASONE) 20 MG tablet 3 tabs po day one, then 2 po daily x 4 days 08/08/17   Noe Gens, PA-C  triamcinolone cream (KENALOG) 0.1 % Apply 1 application topically 2 (two) times daily. 08/08/17   Noe Gens, PA-C    Family History Family History  Problem Relation Age of Onset  . Heart attack Paternal Uncle   . Heart attack Paternal Uncle     Social History Social History   Tobacco Use  . Smoking status: Former Smoker    Packs/day: 0.25    Years: 3.00    Pack years: 0.75    Types: Cigarettes    Last attempt to quit: 01/01/2013    Years since quitting: 4.6  . Smokeless tobacco: Never Used  Substance Use Topics  . Alcohol use: Yes    Alcohol/week: 1.2 oz    Types: 2 Cans of beer per week  . Drug use:  Yes     Allergies   Patient has no known allergies.   Review of Systems Review of Systems  Constitutional: Negative for chills and fever.  HENT: Negative for trouble swallowing.   Respiratory: Negative for shortness of breath.   Skin: Positive for rash. Negative for wound.     Physical Exam Triage Vital Signs ED Triage Vitals  Enc Vitals Group     BP 08/08/17 1115 132/81     Pulse Rate 08/08/17 1115 (!) 50     Resp 08/08/17 1115 16     Temp 08/08/17 1115 97.8 F (36.6 C)     Temp Source 08/08/17 1115 Oral     SpO2 08/08/17 1115 99 %     Weight 08/08/17 1119 153 lb (69.4 kg)     Height --      Head Circumference --      Peak Flow --      Pain Score 08/08/17 1116 0     Pain Loc --      Pain Edu? --      Excl. in [place]? --    No data found.  Updated Vital Signs BP 132/81 (BP Location: Right Arm)   Pulse (!) 50  Temp 97.8 F (36.6 C) (Oral)   Resp 16   Wt 153 lb (69.4 kg)   SpO2 99%   BMI 21.95 kg/m   Visual Acuity Right Eye Distance:   Left Eye Distance:   Bilateral Distance:    Right Eye Near:   Left Eye Near:    Bilateral Near:     Physical Exam  Constitutional: He is oriented to person, place, and time. He appears well-developed and well-nourished. No distress.  HENT:  Head: Normocephalic and atraumatic.  Eyes: EOM are normal.  Neck: Normal range of motion.  Cardiovascular: Normal rate.  Pulmonary/Chest: Effort normal.  Musculoskeletal: Normal range of motion.  Neurological: He is alert and oriented to person, place, and time.  Skin: Skin is warm and dry. Rash noted. He is not diaphoretic. There is erythema.     Diffuse erythematous maculopapular rash on arms, legs, and abdomen. Some lesions are grouped and others in linear pattern. Rash does blanch. Non-tender   Psychiatric: He has a normal mood and affect. His behavior is normal.  Nursing note and vitals reviewed.    UC Treatments / Results  Labs (all labs ordered are listed, but only  abnormal results are displayed) Labs Reviewed - No data to display  EKG None  Radiology No results found.  Procedures Procedures (including critical care time)  Medications Ordered in UC Medications  methylPREDNISolone acetate (DEPO-MEDROL) injection 80 mg (80 mg Intramuscular Given 08/08/17 1132)    Initial Impression / Assessment and Plan / UC Course  I have reviewed the triage vital signs and the nursing notes.  Pertinent labs & imaging results that were available during my care of the patient were reviewed by me and considered in my medical decision making (see chart for details).     Rash c/w contact dermatitis w/o underlying bacterial infection Home care instructions provided below.   Final Clinical Impressions(s) / UC Diagnoses   Final diagnoses:  Contact dermatitis due to plant     Discharge Instructions      You were given a shot of depomedrol (a steroid) today to help with inflammation in your lungs to help you breath better and to help with your cough.  You have been prescribed 5 days of prednisone, an oral steroid.  You may start this medication tomorrow with breakfast.    Please try to avoid scratching the rash and keep clean with warm water and mild soap to help prevent skin infection. Please follow up with your family doctor in 1 week if needed.     ED Prescriptions    Medication Sig Dispense Auth. Provider   predniSONE (DELTASONE) 20 MG tablet 3 tabs po day one, then 2 po daily x 4 days 11 tablet Gerarda Fraction, Juanna Pudlo O, PA-C   triamcinolone cream (KENALOG) 0.1 % Apply 1 application topically 2 (two) times daily. 30 g Noe Gens, PA-C     Controlled Substance Prescriptions [place] Controlled Substance Registry consulted? Not Applicable   Tyrell Antonio 08/08/17 1539

## 2017-08-08 NOTE — Discharge Instructions (Signed)
°  You were given a shot of depomedrol (a steroid) today to help with inflammation in your lungs to help you breath better and to help with your cough.  You have been prescribed 5 days of prednisone, an oral steroid.  You may start this medication tomorrow with breakfast.    Please try to avoid scratching the rash and keep clean with warm water and mild soap to help prevent skin infection. Please follow up with your family doctor in 1 week if needed.

## 2017-08-09 ENCOUNTER — Telehealth: Payer: Self-pay | Admitting: *Deleted

## 2017-08-09 ENCOUNTER — Ambulatory Visit: Payer: BLUE CROSS/BLUE SHIELD | Admitting: Family Medicine

## 2017-08-09 MED ORDER — TRIAMCINOLONE ACETONIDE 0.1 % EX CREA: 1.0000 "application " | TOPICAL_CREAM | Freq: Two times a day (BID) | CUTANEOUS | 1 refills | Status: DC

## 2017-08-09 NOTE — Telephone Encounter (Signed)
Patient's wife reports Roy Mueller has already used the 30 gram tube on his rash and asking for a refill. Per Dr. Assunta Found call in 80gram tube with 1 refill. He had a large surface area to cover.

## 2017-12-05 ENCOUNTER — Other Ambulatory Visit: Payer: Self-pay

## 2017-12-05 MED ORDER — TRIAMCINOLONE ACETONIDE 0.1 % EX CREA: 1.0000 "application " | TOPICAL_CREAM | Freq: Two times a day (BID) | CUTANEOUS | 1 refills | Status: DC

## 2017-12-05 NOTE — Telephone Encounter (Signed)
Patient's wife requests a refill of triamcinolone cream for a rash. Never prescribed by Dr Madilyn Fireman. To [facility] in [place]. Please advise.

## 2017-12-21 ENCOUNTER — Ambulatory Visit (INDEPENDENT_AMBULATORY_CARE_PROVIDER_SITE_OTHER): Payer: 59 | Admitting: Physician Assistant

## 2017-12-21 ENCOUNTER — Encounter: Payer: Self-pay | Admitting: Physician Assistant

## 2017-12-21 VITALS — BP 137/82 | HR 73 | Temp 98.7°F | Wt 157.0 lb

## 2017-12-21 DIAGNOSIS — K1121 Acute sialoadenitis: Secondary | ICD-10-CM

## 2017-12-21 DIAGNOSIS — J029 Acute pharyngitis, unspecified: Secondary | ICD-10-CM

## 2017-12-21 LAB — POCT RAPID STREP A (OFFICE): Rapid Strep A Screen: NEGATIVE

## 2017-12-21 MED ORDER — PREDNISONE 50 MG PO TABS: ORAL_TABLET | ORAL | 0 refills | Status: DC

## 2017-12-21 MED ORDER — AMOXICILLIN-POT CLAVULANATE 875-125 MG PO TABS: 1.0000 | ORAL_TABLET | Freq: Two times a day (BID) | ORAL | 0 refills | Status: AC

## 2017-12-21 NOTE — Patient Instructions (Addendum)
- take antibiotic and steroid as prescribed - avoid any medications or substances that can cause dry mouth (benadryl, OTC sleep aids, antihistamines) - while you are taking Prednisone, no Ibuprofen, Aleve, aspirin etc. You can use Tylenol as needed - suck on cough drops or lemon drops to increase salivary production - apply warm compresses to right cheek as needed  Salivary Gland Infection A salivary gland infection is an infection in one or more of the glands that produce spit (saliva). You have six major salivary glands. Each gland has a duct that carries saliva into your mouth. Saliva keeps your mouth moist and breaks down the food that you eat. It also helps to prevent tooth decay. Two salivary glands are located just in front of your ears (parotid). The ducts for these glands open up inside your cheeks, near your back teeth. You also have two glands under your tongue (sublingual) and two glands under your jaw (submandibular). The ducts for these glands open under your tongue. Any salivary gland can become infected. Most infections occur in the parotid glands or submandibular glands. What are the causes? Salivary glands can be infected by viruses or bacteria.  The mumps virus is the most common cause of viral salivary gland infections, though mumps is now rare in many areas because of vaccination. ? This infection causes swelling in both parotid glands. ? Viral infections are more common in children.  The bacteria that cause salivary gland infections are usually the same bacteria that normally live in your mouth. ? A stone can form in a salivary gland and block the flow of saliva. As a result, saliva backs up into the salivary gland. Bacteria may then start to grow behind the blockage and cause infection. ? Bacterial infections usually cause pain and swelling on one side of the face. Submandibular gland swelling occurs under the jaw. Parotid swelling occurs in front of the ear. ? Bacterial  infections are more common in adults.  What increases the risk? Children who do not get the MMR (measles, mumps, rubella) vaccine are more likely to get mumps, which can cause a viral salivary gland infection. Risk factors for bacterial infections include:  Poor dental care (oral hygiene).  Smoking.  Not drinking enough water.  Having a disease that causes dry mouth and dry eyes (Mikulicz syndrome or Sjogren syndrome).  What are the signs or symptoms? The main sign of salivary gland infection is a swollen salivary gland. This type of inflammation is often called sialadenitis. You may have swelling in front of your ear, under your jaw, or under your tongue. Swelling may get worse when you eat and decrease after you eat. Other signs and symptoms include:  Pain.  Tenderness.  Redness.  Dry mouth.  Bad taste in your mouth.  Difficulty chewing and swallowing.  Fever.  How is this diagnosed? Your health care provider may suspect a salivary gland infection based on your signs and symptoms. He or she will also do a physical exam. The health care provider will look and feel inside your mouth to see whether a stone is blocking a salivary gland duct. You may need to see an ear, nose, and throat specialist (ENT or otolaryngologist) for diagnosis and treatment. You may also need to have diagnostic tests, such as:  An X-ray to check for a stone.  Other imaging studies to look for an abscess and to rule out other causes of swelling. These tests may include: ? Ultrasound. ? CT scan. ? MRI.  Culture and sensitivity test. This involves collecting a sample of pus for testing in the lab to see what bacteria grow and what antibiotics they are sensitive to. The testing sample may be: ? Swabbed from a salivary gland duct. ? Withdrawn from a swollen gland with a needle (aspiration).  How is this treated? Viral salivary gland infections usually clear up without treatment. Bacterial infections  are usually treated with antibiotic medicine. Severe infections that cause difficulty with swallowing may be treated with an IV antibiotic in the hospital. Other treatments may include:  Probing and widening the salivary duct to allow a stone to pass. In some cases, a thin, flexible scope (endoscope) may be inserted into the duct to find a stone and remove it.  Breaking up a stone using sound waves.  Draining an infected gland (abscess) with a needle.  In some cases, you may need surgery so your health care provider can: ? Remove a stone. ? Drain pus from an abscess. ? Remove a badly infected gland.  Follow these instructions at home:  Take medicines only as directed by your health care provider.  If you were prescribed an antibiotic medicine, finish it all even if you start to feel better.  Follow these instructions every few hours: ? Suck on a lemon candy to stimulate the flow of saliva. ? Put a warm compress over the gland. ? Gently massage the gland.  Drink enough fluid to keep your urine clear or pale yellow.  Rinse your mouth with a mixture of warm water and salt every few hours. To make this mixture, add a pinch of salt to 1 cup of warm water.  Practice good oral hygiene by brushing and flossing your teeth after meals and before you go to bed.  Do not use any tobacco products, including cigarettes, chewing tobacco, or electronic cigarettes. If you need help quitting, ask your health care provider. Contact a health care provider if:  You have pain and swelling in your face, jaw, or mouth after eating.  You have persistent swelling in any of these places: ? In front of your ear. ? Under your jaw. ? Inside your mouth. Get help right away if:  You have pain and swelling in your face, jaw, or mouth that are getting worse.  Your pain and swelling make it hard to swallow or breathe. This information is not intended to replace advice given to you by your health care  provider. Make sure you discuss any questions you have with your health care provider. Document Released: 02/11/2004 Document Revised: 06/11/2015 Document Reviewed: 06/05/2013 Elsevier Interactive Patient Education  2018 [employer].

## 2017-12-21 NOTE — Progress Notes (Signed)
HPI:                                                                Roy Mueller is a 52 y.o. male who presents to [facility] today for sore throat  Sore Throat   This is a new problem. The current episode started in the past 7 days (x 2-3 days). The problem has been unchanged. The pain is worse on the right side. There has been no fever. Associated symptoms include congestion, a hoarse voice, swollen glands and trouble swallowing. Pertinent negatives include no drooling, ear pain or vomiting. Associated symptoms comments: + chills + dental pain + facial swelling. He has had no exposure to strep. He has tried NSAIDs for the symptoms. The treatment provided no relief.     Past Medical History:  Diagnosis Date  . GERD (gastroesophageal reflux disease)    Past Surgical History:  Procedure Laterality Date  . BACK SURGERY    . c5-7 cervical diskectomy  2011   and fusion with Crystal Peek spacer  . CHOLECYSTECTOMY, LAPAROSCOPIC  2018   Social History   Tobacco Use  . Smoking status: Former Smoker    Packs/day: 0.25    Years: 3.00    Pack years: 0.75    Types: Cigarettes    Last attempt to quit: 01/01/2013    Years since quitting: 4.9  . Smokeless tobacco: Never Used  Substance Use Topics  . Alcohol use: Yes    Alcohol/week: 2.0 standard drinks    Types: 2 Cans of beer per week   family history includes Heart attack in his paternal uncle and paternal uncle.    ROS: negative except as noted in the HPI  Medications: Current Outpatient Medications  Medication Sig Dispense Refill  . amoxicillin-clavulanate (AUGMENTIN) 875-125 MG tablet Take 1 tablet by mouth 2 (two) times daily for 7 days. 14 tablet 0  . predniSONE (DELTASONE) 50 MG tablet One tab PO daily for 5 days. 5 tablet 0   No current facility-administered medications for this visit.    No Known Allergies     Objective:  BP 137/82   Pulse 73   Temp 98.7 F  (37.1 C) (Oral)   Wt 157 lb (71.2 kg)   BMI 22.53 kg/m  Gen:  alert, not ill-appearing, no distress, appropriate for age 59: right cheek appears mildly swollen compared to left, conjunctiva and cornea clear, tympanic membranes pearly gray and semitransparent bilaterally, nasal mucosa pink, oropharynx with erythema, grade 2+ tonsils without exudates, uvula midline, patient demonstrates right-sided throat pain with jaw opening, he is tender at the right retromolar trigone, neck supple, no pre-auricular, post-auricular, submandibular, submental or cervical adenopathy, trachea midline Pulm: Normal work of breathing, normal phonation, clear to auscultation bilaterally, no wheezes, rales or rhonchi CV: Normal rate, regular rhythm, s1 and s2 distinct, no murmurs, clicks or rubs  Neuro: alert and oriented x 3, no tremor MSK: extremities atraumatic, normal gait and station Skin: intact, no rashes on exposed skin, no jaundice, no cyanosis   Results for orders placed or performed in visit on 12/21/17 (from the past 72 hour(s))  POCT rapid strep A     Status: Normal   Collection Time: 12/21/17  4:06 PM  Result Value Ref Range   Rapid Strep A Screen Negative Negative   No results found.    Assessment and Plan: 52 y.o. male with   .Diagnoses and all orders for this visit:  Acute sialoadenitis -     CBC with Differential/Platelet -     Mumps Antibody, IgM -     Amylase -     predniSONE (DELTASONE) 50 MG tablet; One tab PO daily for 5 days. -     amoxicillin-clavulanate (AUGMENTIN) 875-125 MG tablet; Take 1 tablet by mouth 2 (two) times daily for 7 days.  Acute pharyngitis, unspecified etiology -     POCT rapid strep A   3 days of right-sided unilateral severe throat pain associated with difficulty swallowing. Possible early trismus on exam and mild right-sided facial swelling with tenderness in the area of the parotid gland Differential includes parotitis, bacterial sialoadenitis, viral  sialoadenitis, mumps, infectious mono, other viral pharyngitis POC Strep A negative Steroid burst for pain and swelling Augmentin to cover for bacterial sialoadenitis Counseled on general measures - avoid anticholinergics, hydrate, suck on cough drops/suckers to stimulate salivary production  Patient education and anticipatory guidance given Patient agrees with treatment plan Follow-up as needed if symptoms worsen or fail to improve  Darlyne Russian PA-C

## 2017-12-23 LAB — CBC WITH DIFFERENTIAL/PLATELET
BASOS ABS: 26 {cells}/uL (ref 0–200)
Basophils Relative: 0.2 %
EOS PCT: 0.9 %
Eosinophils Absolute: 115 cells/uL (ref 15–500)
HCT: 44.2 % (ref 38.5–50.0)
Hemoglobin: 15 g/dL (ref 13.2–17.1)
Lymphs Abs: 2022 cells/uL (ref 850–3900)
MCH: 30 pg (ref 27.0–33.0)
MCHC: 33.9 g/dL (ref 32.0–36.0)
MCV: 88.4 fL (ref 80.0–100.0)
MONOS PCT: 9 %
MPV: 9.1 fL (ref 7.5–12.5)
NEUTROS PCT: 74.1 %
Neutro Abs: 9485 cells/uL — ABNORMAL HIGH (ref 1500–7800)
PLATELETS: 303 10*3/uL (ref 140–400)
RBC: 5 10*6/uL (ref 4.20–5.80)
RDW: 11.9 % (ref 11.0–15.0)
Total Lymphocyte: 15.8 %
WBC mixed population: 1152 cells/uL — ABNORMAL HIGH (ref 200–950)
WBC: 12.8 10*3/uL — AB (ref 3.8–10.8)

## 2017-12-23 LAB — AMYLASE: AMYLASE: 42 U/L (ref 21–101)

## 2017-12-23 LAB — MUMPS ANTIBODY, IGM: Mumps IgM Value: <1:20 {titer}

## 2018-04-02 ENCOUNTER — Encounter: Payer: Self-pay | Admitting: Family Medicine

## 2018-08-16 LAB — LIPID PANEL
Cholesterol: 181 (ref 0–200)
HDL: 66 (ref 35–70)
LDL Cholesterol: 100
Triglycerides: 65 (ref 40–160)

## 2018-08-16 LAB — HEPATIC FUNCTION PANEL
ALT: 16 (ref 10–40)
AST: 20 (ref 14–40)
Alkaline Phosphatase: 80 (ref 25–125)

## 2018-08-16 LAB — CBC AND DIFFERENTIAL
HCT: 46 (ref 41–53)
Hemoglobin: 15 (ref 13.5–17.5)
WBC: 5.2

## 2018-08-16 LAB — VITAMIN D 25 HYDROXY (VIT D DEFICIENCY, FRACTURES): Vit D, 25-Hydroxy: 58

## 2018-08-16 LAB — BASIC METABOLIC PANEL: Glucose: 95

## 2018-08-16 LAB — TSH: TSH: 1.33 (ref 0.41–5.90)

## 2018-11-21 ENCOUNTER — Other Ambulatory Visit: Payer: Self-pay

## 2018-11-21 ENCOUNTER — Ambulatory Visit (INDEPENDENT_AMBULATORY_CARE_PROVIDER_SITE_OTHER): Payer: 59 | Admitting: Family Medicine

## 2018-11-21 ENCOUNTER — Encounter: Payer: Self-pay | Admitting: Family Medicine

## 2018-11-21 VITALS — BP 122/70 | HR 73 | Ht 70.0 in | Wt 164.0 lb

## 2018-11-21 DIAGNOSIS — G5603 Carpal tunnel syndrome, bilateral upper limbs: Secondary | ICD-10-CM | POA: Diagnosis not present

## 2018-11-21 DIAGNOSIS — M4722 Other spondylosis with radiculopathy, cervical region: Secondary | ICD-10-CM

## 2018-11-21 DIAGNOSIS — R252 Cramp and spasm: Secondary | ICD-10-CM | POA: Diagnosis not present

## 2018-11-21 DIAGNOSIS — R202 Paresthesia of skin: Secondary | ICD-10-CM

## 2018-11-21 DIAGNOSIS — R200 Anesthesia of skin: Secondary | ICD-10-CM | POA: Diagnosis not present

## 2018-11-21 DIAGNOSIS — E041 Nontoxic single thyroid nodule: Secondary | ICD-10-CM

## 2018-11-21 NOTE — Assessment & Plan Note (Signed)
New diagnosis.  Positive Tinel's and Phalen sign.  Given cock-up splints for treatment.  If not improving over the next 6 weeks then please follow-up with our sports med doc for possible injections and additional treatment.

## 2018-11-21 NOTE — Patient Instructions (Addendum)
If numbness and tingling in hands or not improving with the splints at night after the next 4 to 6 weeks then please let us know and we will get you scheduled with Dr. Dianah Field here in our office for steroid injections.   Leg Cramps Leg cramps occur when one or more muscles tighten and you have no control over this tightening (involuntary muscle contraction). Muscle cramps can develop in any muscle, but the most common place is in the calf muscles of the leg. Those cramps can occur during exercise or when you are at rest. Leg cramps are painful, and they may last for a few seconds to a few minutes. Cramps may return several times before they finally stop. Usually, leg cramps are not caused by a serious medical problem. In many cases, the cause is not known. Some common causes include:  Excessive physical effort (overexertion), such as during intense exercise.  Overuse from repetitive motions, or doing the same thing over and over.  Staying in a certain position for a long period of time.  Improper preparation, form, or technique while performing a sport or an activity.  Dehydration.  Injury.  Side effects of certain medicines.  Abnormally low levels of minerals in your blood (electrolytes), especially potassium and calcium. This could result from: ? Pregnancy. ? Taking diuretic medicines. Follow these instructions at home: Eating and drinking  Drink enough fluid to keep your urine pale yellow. Staying hydrated may help prevent cramps.  Eat a healthy diet that includes plenty of nutrients to help your muscles function. A healthy diet includes fruits and vegetables, lean protein, whole grains, and low-fat or nonfat dairy products. Managing pain, stiffness, and swelling      Try massaging, stretching, and relaxing the affected muscle. Do this for several minutes at a time.  If directed, put ice on areas that are sore or painful after a cramp: ? Put ice in a plastic  bag. ? Place a towel between your skin and the bag. ? Leave the ice on for 20 minutes, 2-3 times a day.  If directed, apply heat to muscles that are tense or tight. Do this before you exercise, or as often as told by your health care provider. Use the heat source that your health care provider recommends, such as a moist heat pack or a heating pad. ? Place a towel between your skin and the heat source. ? Leave the heat on for 20-30 minutes. ? Remove the heat if your skin turns bright red. This is especially important if you are unable to feel pain, heat, or cold. You may have a greater risk of getting burned.  Try taking hot showers or baths to help relax tight muscles. General instructions  If you are having frequent leg cramps, avoid intense exercise for several days.  Take over-the-counter and prescription medicines only as told by your health care provider.  Keep all follow-up visits as told by your health care provider. This is important. Contact a health care provider if:  Your leg cramps get more severe or more frequent, or they do not improve over time.  Your foot becomes cold, numb, or blue. Summary  Muscle cramps can develop in any muscle, but the most common place is in the calf muscles of the leg.  Leg cramps are painful, and they may last for a few seconds to a few minutes.  Usually, leg cramps are not caused by a serious medical problem. Often, the cause is not known.  Stay hydrated and take over-the-counter and prescription medicines only as told by your health care provider. This information is not intended to replace advice given to you by your health care provider. Make sure you discuss any questions you have with your health care provider. Document Released: 02/11/2004 Document Revised: 12/16/2016 Document Reviewed: 10/13/2016 Elsevier Patient Education  2020 [place] tunnel syndrome is a condition that causes pain in your  hand and arm. The carpal tunnel is a narrow area that is on the palm side of your wrist. Repeated wrist motion or certain diseases may cause swelling in the tunnel. This swelling can pinch the main nerve in the wrist (median nerve). What are the causes? This condition may be caused by:  Repeated wrist motions.  Wrist injuries.  Arthritis.  A sac of fluid (cyst) or abnormal growth (tumor) in the carpal tunnel.  Fluid buildup during pregnancy. Sometimes the cause is not known. What increases the risk? The following factors may make you more likely to develop this condition:  Having a job in which you move your wrist in the same way many times. This includes jobs like being a Software engineer or a Scientist, water quality.  Being a woman.  Having other health conditions, such as: ? Diabetes. ? Obesity. ? A thyroid gland that is not active enough (hypothyroidism). ? Kidney failure. What are the signs or symptoms? Symptoms of this condition include:  A tingling feeling in your fingers.  Tingling or a loss of feeling (numbness) in your hand.  Pain in your entire arm. This pain may get worse when you bend your wrist and elbow for a long time.  Pain in your wrist that goes up your arm to your shoulder.  Pain that goes down into your palm or fingers.  A weak feeling in your hands. You may find it hard to grab and hold items. You may feel worse at night. How is this diagnosed? This condition is diagnosed with a medical history and physical exam. You may also have tests, such as:  Electromyogram (EMG). This test checks the signals that the nerves send to the muscles.  Nerve conduction study. This test checks how well signals pass through your nerves.  Imaging tests, such as X-rays, ultrasound, and MRI. These tests check for what might be the cause of your condition. How is this treated? This condition may be treated with:  Lifestyle changes. You will be asked to stop or change the activity that caused  your problem.  Doing exercise and activities that make bones and muscles stronger (physical therapy).  Learning how to use your hand again (occupational therapy).  Medicines for pain and swelling (inflammation). You may have injections in your wrist.  A wrist splint.  Surgery. Follow these instructions at home: If you have a splint:  Wear the splint as told by your doctor. Remove it only as told by your doctor.  Loosen the splint if your fingers: ? Tingle. ? Lose feeling (become numb). ? Turn cold and blue.  Keep the splint clean.  If the splint is not waterproof: ? Do not let it get wet. ? Cover it with a watertight covering when you take a bath or a shower. Managing pain, stiffness, and swelling   If told, put ice on the painful area: ? If you have a removable splint, remove it as told by your doctor. ? Put ice in a plastic bag. ? Place a towel between your skin and the  bag. ? Leave the ice on for 20 minutes, 2-3 times per day. General instructions  Take over-the-counter and prescription medicines only as told by your doctor.  Rest your wrist from any activity that may cause pain. If needed, talk with your boss at work about changes that can help your wrist heal.  Do any exercises as told by your doctor, physical therapist, or occupational therapist.  Keep all follow-up visits as told by your doctor. This is important. Contact a doctor if:  You have new symptoms.  Medicine does not help your pain.  Your symptoms get worse. Get help right away if:  You have very bad numbness or tingling in your wrist or hand. Summary  Carpal tunnel syndrome is a condition that causes pain in your hand and arm.  It is often caused by repeated wrist motions.  Lifestyle changes and medicines are used to treat this problem. Surgery may help in very bad cases.  Follow your doctor's instructions about wearing a splint, resting your wrist, keeping follow-up visits, and calling  for help. This information is not intended to replace advice given to you by your health care provider. Make sure you discuss any questions you have with your health care provider. Document Released: 12/23/2010 Document Revised: 05/12/2017 Document Reviewed: 05/12/2017 Elsevier Patient Education  2020 [employer].

## 2018-11-21 NOTE — Assessment & Plan Note (Signed)
Currently on chronic pain medications and getting epidurals periodically.  Am very concerned about the left leg going numb and weak particularly when he is sitting in the mornings.  Encouraged him to speak with his pain management provider about this.

## 2018-11-21 NOTE — Progress Notes (Signed)
Acute Office Visit  Subjective:    Patient ID: Roy Mueller, male    DOB: August 16, 1965, 53 y.o.   MRN: [id number]  Chief Complaint  Patient presents with  . Hand Pain    bilateral  . Foot Pain    bilateral  . Numbness    bilateral hands and feet    HPI Roy Mueller is a 53 year old male who has a history of chronic low back pain and is currently under the care of pain management specialist.  He has had facet injections in his lumbar spine as well as more recently epidurals and even a nerve ablation.  But he is here today because over the last 6 months he has developed some new symptoms that he has never really had before.  He complains of numbness in both hands and feet x 6 months.  He says that the numbness and tingling affects both hands and it is the entire T of the hand all of the fingers.  Though his left hand bothers him worse than his right.  Also in his feet he gets some tingling in both feet but it is definitely worse on the left side.  He says pretty much from his heel to his toes on the left foot he feels like it is completely numb.  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   Gets some cramps in his right calf as well and this is new.  He is not sure what may be causing this..  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  He is concerned about Diabetes bc of family history.  He had some blood work done back in March.  A1c at that time went fantastic at 5.1.  Thyroid levels were normal.  B12 levels were normal.  Cholesterol was okay.  He does have a history of thyroid nodules and gets a yearly ultrasound.  Testosterone levels were little bit low.  He did let me know that he does intermittent fasting but otherwise does not follow any special diet such as vegetarian or vegan.  He says he might consume 2 alcoholic drinks every 2 weeks.  He is on chronic pain management with hydromorphone and hydrocodone.  He also complains of bilateral wrist pain.  He denies any known triggers  or repetitive movements again he has been out of work for quite a while.  He also reports that his left leg will go numb in the mornings when he is sitting on the toilet going to the bathroom.  To the point that it feels completely like dead weight.  This is been going on for about 6 months as well.  He is here today with his wife.  Past Medical History:  Diagnosis Date  . GERD (gastroesophageal reflux disease)     Past Surgical History:  Procedure Laterality Date  . BACK SURGERY    . c5-7 cervical diskectomy  2011   and fusion with Crystal Peek spacer  . CHOLECYSTECTOMY, LAPAROSCOPIC  2018    Family History  Problem Relation Age of Onset  . Heart attack Paternal Uncle   . Heart attack Paternal Uncle     Social History   Socioeconomic History  . Marital status: Married    Spouse name: [name]  . Number of children: 2   . Years of education: Not on file  . Highest education level: Not on file  Occupational History  . Occupation: Clinical biochemist.     Employer: [employer]    Comment: Technical brewer  Social Needs  . Financial resource strain: Not on file  . Food insecurity    Worry: Not on file    Inability: Not on file  . Transportation needs    Medical: Not on file    Non-medical: Not on file  Tobacco Use  . Smoking status: Former Smoker    Packs/day: 0.25    Years: 3.00    Pack years: 0.75    Types: Cigarettes    Quit date: 01/01/2013    Years since quitting: 5.8  . Smokeless tobacco: Never Used  Substance and Sexual Activity  . Alcohol use: Yes    Alcohol/week: 2.0 standard drinks    Types: 2 Cans of beer per week  . Drug use: Yes  . Sexual activity: Yes    Partners: Female  Lifestyle  . Physical activity    Days per week: Not on file    Minutes per session: Not on file  . Stress: Not on file  Relationships  . Social Herbalist on phone: Not on file    Gets together: Not on file    Attends religious service: Not on file     Active member of club or organization: Not on file    Attends meetings of clubs or organizations: Not on file    Relationship status: Not on file  . Intimate partner violence    Fear of current or ex partner: Not on file    Emotionally abused: Not on file    Physically abused: Not on file    Forced sexual activity: Not on file  Other Topics Concern  . Not on file  Social History Narrative   + regular exercise.      Outpatient Medications Prior to Visit  Medication Sig Dispense Refill  . HYDROcodone-acetaminophen (NORCO) 10-325 MG tablet Take 1 tablet by mouth every 6 (six) hours as needed.    [name] HYDROmorphone (DILAUDID) 4 MG tablet Take by mouth every 6 (six) hours as needed.    . predniSONE (DELTASONE) 50 MG tablet One tab PO daily for 5 days. 5 tablet 0   No facility-administered medications prior to visit.     No Known Allergies  ROS     Objective:    Physical Exam  Constitutional: He is oriented to person, place, and time. He appears well-developed and well-nourished.  HENT:  Head: Normocephalic and atraumatic.  Cardiovascular: Normal rate, regular rhythm and normal heart sounds.  Pulmonary/Chest: Effort normal and breath sounds normal.  Musculoskeletal:     Comments: Wrists and fingers with normal range of motion.  No significant swelling of the joints.  Positive Tinel's and Phalen sign.  Neurological: He is alert and oriented to person, place, and time.  Skin: Skin is warm and dry.  Psychiatric: He has a normal mood and affect. His behavior is normal.    BP 122/70   Pulse 73   Ht 5\' 10"  (1.778 m)   Wt 164 lb (74.4 kg)   SpO2 100%   BMI 23.53 kg/m  Wt Readings from Last 3 Encounters:  11/21/18 164 lb (74.4 kg)  12/21/17 157 lb (71.2 kg)  08/08/17 153 lb (69.4 kg)    There are no preventive care reminders to display for this patient.  There are no preventive care reminders to display for this patient.   No results found for: TSH Lab Results   Component Value Date   WBC 12.8 (H) 12/21/2017  HGB 15.0 12/21/2017   HCT 44.2 12/21/2017   MCV 88.4 12/21/2017   PLT 303 12/21/2017   Lab Results  Component Value Date   NA 138 11/29/2016   K 3.9 11/29/2016   CO2 31 11/29/2016   GLUCOSE 74 11/29/2016   BUN 17 11/29/2016   CREATININE 0.85 11/29/2016   BILITOT 0.8 11/29/2016   ALKPHOS 90 04/05/2013   AST 22 11/29/2016   ALT 21 11/29/2016   PROT 6.8 11/29/2016   ALBUMIN 4.3 04/05/2013   CALCIUM 9.2 11/29/2016   Lab Results  Component Value Date   CHOL 165 04/05/2013   Lab Results  Component Value Date   HDL 42 04/05/2013   Lab Results  Component Value Date   LDLCALC 103 (H) 04/05/2013   Lab Results  Component Value Date   TRIG 100 04/05/2013   Lab Results  Component Value Date   CHOLHDL 3.9 04/05/2013   No results found for: HGBA1C     Assessment & Plan:   Problem List Items Addressed This Visit      Endocrine   Thyroid nodule    Following yearly with ultrasound and thyroid labs.  We were able to get a copy of recent labs done in July and they are normal.        Nervous and Auditory   Osteoarthritis of spine with radiculopathy, cervical region    Currently on chronic pain medications and getting epidurals periodically.  Am very concerned about the left leg going numb and weak particularly when he is sitting in the mornings.  Encouraged him to speak with his pain management provider about this.      Relevant Medications   HYDROcodone-acetaminophen (NORCO) 10-325 MG tablet   HYDROmorphone (DILAUDID) 4 MG tablet   Bilateral carpal tunnel syndrome - Primary    New diagnosis.  Positive Tinel's and Phalen sign.  Given cock-up splints for treatment.  If not improving over the next 6 weeks then please follow-up with our sports med doc for possible injections and additional treatment.       Other Visit Diagnoses    Paresthesia of both hands       Relevant Orders   B12   Vitamin B6   Vitamin B1   CBC  with Differential/Platelet   Sedimentation rate   C-reactive protein   CK (Creatine Kinase)   Magnesium   Uric acid   Ferritin   Paresthesia of both feet       Relevant Orders   B12   Vitamin B6   Vitamin B1   CBC with Differential/Platelet   Sedimentation rate   C-reactive protein   CK (Creatine Kinase)   Magnesium   Uric acid   Ferritin   Left leg numbness       Relevant Orders   B12   Vitamin B6   Vitamin B1   CBC with Differential/Platelet   Sedimentation rate   C-reactive protein   CK (Creatine Kinase)   Magnesium   Uric acid   Ferritin   Muscle cramping       Relevant Orders   B12   Vitamin B6   Vitamin B1   CBC with Differential/Platelet   Sedimentation rate   C-reactive protein   CK (Creatine Kinase)   Magnesium   Uric acid   Ferritin     Paresthesia is of both hands-most consistent with carpal tunnel.  We will fit him with bilateral cock-up splints.  Explained that it often takes several weeks to  notice significant improvement in the numbness and tingling.  If by 6 weeks he is not noticing some improvement then recommend referral to our sports medicine provider for injection/treatment.  Paresthesias of feet-could certainly be related to cervical spine issues but may also be because of a deficiency or thyroid problem.  Also check for anemia.  Muscle cramping-we will check a CK as well as magnesium level.  Consider B6 supplementation if everything comes back normal.  It sounds like he gets very easily fatigued and has a lot of pain with even minimal activity.  Again we will check inflammatory markers as well as CK level.  Consider alternative diagnoses such as fibromyalgia.   No orders of the defined types were placed in this encounter.  Time spent 40 minutes, greater than 50% of the time spent face-to-face counseling about low back pain, arthritis, leg numbness, paresthesias of hands and feet.  Beatrice Lecher, MD

## 2018-11-21 NOTE — Assessment & Plan Note (Signed)
Following yearly with ultrasound and thyroid labs.  We were able to get a copy of recent labs done in July and they are normal.

## 2018-11-25 LAB — CBC WITH DIFFERENTIAL/PLATELET
Absolute Monocytes: 366 cells/uL (ref 200–950)
Basophils Absolute: 18 cells/uL (ref 0–200)
Basophils Relative: 0.3 %
Eosinophils Absolute: 102 cells/uL (ref 15–500)
Eosinophils Relative: 1.7 %
HCT: 45.8 % (ref 38.5–50.0)
Hemoglobin: 15.3 g/dL (ref 13.2–17.1)
Lymphs Abs: 2010 cells/uL (ref 850–3900)
MCH: 30.6 pg (ref 27.0–33.0)
MCHC: 33.4 g/dL (ref 32.0–36.0)
MCV: 91.6 fL (ref 80.0–100.0)
MPV: 9.1 fL (ref 7.5–12.5)
Monocytes Relative: 6.1 %
Neutro Abs: 3504 cells/uL (ref 1500–7800)
Neutrophils Relative %: 58.4 %
Platelets: 296 10*3/uL (ref 140–400)
RBC: 5 10*6/uL (ref 4.20–5.80)
RDW: 11.8 % (ref 11.0–15.0)
Total Lymphocyte: 33.5 %
WBC: 6 10*3/uL (ref 3.8–10.8)

## 2018-11-25 LAB — VITAMIN B12: Vitamin B-12: 1057 pg/mL (ref 200–1100)

## 2018-11-25 LAB — C-REACTIVE PROTEIN: CRP: 0.5 mg/L (ref <8.0)

## 2018-11-25 LAB — CK: Total CK: 125 U/L (ref 44–196)

## 2018-11-25 LAB — URIC ACID: Uric Acid, Serum: 5.8 mg/dL (ref 4.0–8.0)

## 2018-11-25 LAB — SEDIMENTATION RATE: Sed Rate: 2 mm/h (ref 0–20)

## 2018-11-25 LAB — VITAMIN B6: Vitamin B6: 9.4 ng/mL (ref 2.1–21.7)

## 2018-11-25 LAB — VITAMIN B1: Vitamin B1 (Thiamine): 7 nmol/L — ABNORMAL LOW (ref 8–30)

## 2018-11-25 LAB — FERRITIN: Ferritin: 221 ng/mL (ref 38–380)

## 2018-11-25 LAB — MAGNESIUM: Magnesium: 1.9 mg/dL (ref 1.5–2.5)

## 2018-11-29 ENCOUNTER — Encounter: Payer: Self-pay | Admitting: Family Medicine

## 2018-12-06 ENCOUNTER — Ambulatory Visit (INDEPENDENT_AMBULATORY_CARE_PROVIDER_SITE_OTHER): Payer: 59 | Admitting: Family Medicine

## 2018-12-06 ENCOUNTER — Other Ambulatory Visit: Payer: Self-pay

## 2018-12-06 ENCOUNTER — Encounter: Payer: Self-pay | Admitting: Family Medicine

## 2018-12-06 VITALS — BP 112/61 | HR 58 | Ht 70.0 in | Wt 167.0 lb

## 2018-12-06 DIAGNOSIS — M5442 Lumbago with sciatica, left side: Secondary | ICD-10-CM

## 2018-12-06 DIAGNOSIS — M5441 Lumbago with sciatica, right side: Secondary | ICD-10-CM

## 2018-12-06 DIAGNOSIS — G8929 Other chronic pain: Secondary | ICD-10-CM

## 2018-12-06 DIAGNOSIS — E519 Thiamine deficiency, unspecified: Secondary | ICD-10-CM

## 2018-12-06 DIAGNOSIS — R202 Paresthesia of skin: Secondary | ICD-10-CM | POA: Diagnosis not present

## 2018-12-06 DIAGNOSIS — G5603 Carpal tunnel syndrome, bilateral upper limbs: Secondary | ICD-10-CM

## 2018-12-06 NOTE — Assessment & Plan Note (Signed)
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  I printed a copy of those for him today and reviewed it with him.  Recommend that he go ahead and start 100 mg of thiamine daily.  If he has a hard time finding it he can always get a B complex just make sure that it actually has vitamin B1 in it.  I do think this could help with some of the paresthesias he has been experiencing which is a new finding.  But explained that it may not completely correct then paresthesias that he has as I do think he still has some underlying carpal tunnel and possible neuropathy from his back.  Other labs were all normal.

## 2018-12-06 NOTE — Progress Notes (Signed)
Established Patient Office Visit  Subjective:  Patient ID: Roy Mueller, male    DOB: Feb 14, 1965  Age: 52 y.o. MRN: [id number]  CC:  Chief Complaint  Patient presents with  . Numbness    he reports that there has not been any change. he will be leaving to go back to [place] and cane in to be checked prior to leaving. he has been wearing the cock up splints as directed    HPI Roy Mueller presents for F/U up previous visit about 2 weeks ago.  He had come in complaining of new onset paresthesias in the hands and feet.  He has known chronic back problems and is actively under treatment for that   but this was new.  We did some additional lab work-up and found that his thiamine levels were low at seven.  Encouraged him to start a thiamine supplement.  He also felt like he still had some carpal tunnel symptoms.  He says that he did not realize that he was actually supposed to start a supplement so he has not done that yet.  For the carpal tunnel he was given cock-up splints to start wearing at night.  He has been wearing them consistently at night but he still getting a lot of numbness and tingling even during the daytime.  He says even just sitting here he is failing it he knows it can take a while to really improve.  He was also experiencing muscle cramping.  CK and sed rate were normal.  He started using a told that, helps massage the muscles particularly the calves and his right leg and says he does feel like it is actually been a little bit helpful so he will continue to do that.  Past Medical History:  Diagnosis Date  . GERD (gastroesophageal reflux disease)     Past Surgical History:  Procedure Laterality Date  . BACK SURGERY    . c5-7 cervical diskectomy  2011   and fusion with Crystal Peek spacer  . CHOLECYSTECTOMY, LAPAROSCOPIC  2018    Family History  Problem Relation Age of Onset  . Heart attack Paternal Uncle   . Heart attack Paternal Uncle     Social History    Socioeconomic History  . Marital status: Married    Spouse name: [name]  . Number of children: 2   . Years of education: Not on file  . Highest education level: Not on file  Occupational History  . Occupation: Clinical biochemist.     Employer: [employer]    Comment: Technical brewer  Social Needs  . Financial resource strain: Not on file  . Food insecurity    Worry: Not on file    Inability: Not on file  . Transportation needs    Medical: Not on file    Non-medical: Not on file  Tobacco Use  . Smoking status: Former Smoker    Packs/day: 0.25    Years: 3.00    Pack years: 0.75    Types: Cigarettes    Quit date: 01/01/2013    Years since quitting: 5.9  . Smokeless tobacco: Never Used  Substance and Sexual Activity  . Alcohol use: Yes    Alcohol/week: 2.0 standard drinks    Types: 2 Cans of beer per week  . Drug use: Yes  . Sexual activity: Yes    Partners: Female  Lifestyle  . Physical activity    Days per week: Not on file    Minutes per session: Not  on file  . Stress: Not on file  Relationships  . Social Herbalist on phone: Not on file    Gets together: Not on file    Attends religious service: Not on file    Active member of club or organization: Not on file    Attends meetings of clubs or organizations: Not on file    Relationship status: Not on file  . Intimate partner violence    Fear of current or ex partner: Not on file    Emotionally abused: Not on file    Physically abused: Not on file    Forced sexual activity: Not on file  Other Topics Concern  . Not on file  Social History Narrative   + regular exercise.      Outpatient Medications Prior to Visit  Medication Sig Dispense Refill  . HYDROcodone-acetaminophen (NORCO) 10-325 MG tablet Take 1 tablet by mouth every 6 (six) hours as needed.    [name] HYDROmorphone (DILAUDID) 4 MG tablet Take by mouth every 6 (six) hours as needed.     No facility-administered medications prior to  visit.     No Known Allergies  ROS Review of Systems    Objective:    Physical Exam  Constitutional: He is oriented to person, place, and time. He appears well-developed and well-nourished.  HENT:  Head: Normocephalic and atraumatic.  Eyes: Conjunctivae and EOM are normal.  Cardiovascular: Normal rate.  Pulmonary/Chest: Effort normal.  Neurological: He is alert and oriented to person, place, and time.  Skin: Skin is dry. No pallor.  Psychiatric: He has a normal mood and affect. His behavior is normal.  Vitals reviewed.   BP 112/61   Pulse (!) 58   Ht 5\' 10"  (1.778 m)   Wt 167 lb (75.8 kg)   SpO2 100%   BMI 23.96 kg/m  Wt Readings from Last 3 Encounters:  12/06/18 167 lb (75.8 kg)  11/21/18 164 lb (74.4 kg)  12/21/17 157 lb (71.2 kg)     There are no preventive care reminders to display for this patient.  There are no preventive care reminders to display for this patient.  Lab Results  Component Value Date   TSH 1.33 08/16/2018   Lab Results  Component Value Date   WBC 6.0 11/21/2018   HGB 15.3 11/21/2018   HCT 45.8 11/21/2018   MCV 91.6 11/21/2018   PLT 296 11/21/2018   Lab Results  Component Value Date   NA 138 11/29/2016   K 3.9 11/29/2016   CO2 31 11/29/2016   GLUCOSE 74 11/29/2016   BUN 17 11/29/2016   CREATININE 0.85 11/29/2016   BILITOT 0.8 11/29/2016   ALKPHOS 80 08/16/2018   AST 20 08/16/2018   ALT 16 08/16/2018   PROT 6.8 11/29/2016   ALBUMIN 4.3 04/05/2013   CALCIUM 9.2 11/29/2016   Lab Results  Component Value Date   CHOL 181 08/16/2018   Lab Results  Component Value Date   HDL 66 08/16/2018   Lab Results  Component Value Date   LDLCALC 100 08/16/2018   Lab Results  Component Value Date   TRIG 65 08/16/2018   Lab Results  Component Value Date   CHOLHDL 3.9 04/05/2013   No results found for: HGBA1C    Assessment & Plan:   Problem List Items Addressed This Visit      Nervous and Auditory   Chronic bilateral low  back pain with bilateral sciatica    Continue to follow  with chronic pain management.  He still getting the paresthesias in his feet.  Again this still could be coming from his back but may need additional work-up just to rule out other forms of neuropathy such as demyelinating disease.  Though he has not had any significant weakness which is reassuring.      Bilateral carpal tunnel syndrome    Continue with night splints for at least 6 weeks if he is noticing some improvement then continue.  If it is only mild improvement then could consider injections we discussed that today.  If no improvement at all then I think he would benefit from a nerve conduction study and can discuss that with his provider in [place].        Other   Thiamin deficiency - Primary    He was found to have a thiamine deficiency on lab.  I printed a copy of those for him today and reviewed it with him.  Recommend that he go ahead and start 100 mg of thiamine daily.  If he has a hard time finding it he can always get a B complex just make sure that it actually has vitamin B1 in it.  I do think this could help with some of the paresthesias he has been experiencing which is a new finding.  But explained that it may not completely correct then paresthesias that he has as I do think he still has some underlying carpal tunnel and possible neuropathy from his back.  Other labs were all normal.       Other Visit Diagnoses    Paresthesia of both hands          No orders of the defined types were placed in this encounter.   Follow-up: Return if symptoms worsen or fail to improve.    Beatrice Lecher, MD

## 2018-12-06 NOTE — Assessment & Plan Note (Signed)
Continue to follow with chronic pain management.  He still getting the paresthesias in his feet.  Again this still could be coming from his back but may need additional work-up just to rule out other forms of neuropathy such as demyelinating disease.  Though he has not had any significant weakness which is reassuring.

## 2018-12-06 NOTE — Assessment & Plan Note (Signed)
Continue with night splints for at least 6 weeks if he is noticing some improvement then continue.  If it is only mild improvement then could consider injections we discussed that today.  If no improvement at all then I think he would benefit from a nerve conduction study and can discuss that with his provider in [place].

## 2019-01-03 ENCOUNTER — Ambulatory Visit: Payer: 59 | Admitting: Family Medicine

## 2019-01-08 LAB — HM COLONOSCOPY

## 2019-03-27 ENCOUNTER — Ambulatory Visit (INDEPENDENT_AMBULATORY_CARE_PROVIDER_SITE_OTHER): Payer: 59 | Admitting: Family Medicine

## 2019-03-27 DIAGNOSIS — Z0000 Encounter for general adult medical examination without abnormal findings: Secondary | ICD-10-CM

## 2019-03-28 ENCOUNTER — Encounter: Payer: Self-pay | Admitting: Family Medicine

## 2019-04-01 ENCOUNTER — Telehealth: Payer: Self-pay

## 2019-04-01 MED ORDER — TRIAMCINOLONE ACETONIDE 0.1 % EX CREA: 1.0000 "application " | TOPICAL_CREAM | Freq: Two times a day (BID) | CUTANEOUS | 0 refills | Status: DC | PRN

## 2019-04-01 NOTE — Telephone Encounter (Signed)
Left pt msg advising RX at pharmacy  

## 2019-04-01 NOTE — Telephone Encounter (Signed)
Roy Mueller's wife called for a refill on a cream for poison ivy. She states he is schedule for a physical tomorrow, it is actually next Tuesday. Please advise.

## 2019-04-01 NOTE — Telephone Encounter (Signed)
Scription sent to [facility].  It looks like we have actually given him triamcinolone in the past so I just sent the same thing.

## 2019-04-03 ENCOUNTER — Encounter: Payer: Self-pay | Admitting: Family Medicine

## 2019-04-09 ENCOUNTER — Encounter: Payer: Self-pay | Admitting: Family Medicine

## 2019-04-09 ENCOUNTER — Ambulatory Visit (INDEPENDENT_AMBULATORY_CARE_PROVIDER_SITE_OTHER): Payer: 59 | Admitting: Family Medicine

## 2019-04-09 ENCOUNTER — Other Ambulatory Visit: Payer: Self-pay

## 2019-04-09 VITALS — BP 126/64 | HR 51 | Ht 70.0 in | Wt 169.0 lb

## 2019-04-09 DIAGNOSIS — E519 Thiamine deficiency, unspecified: Secondary | ICD-10-CM

## 2019-04-09 DIAGNOSIS — Z0000 Encounter for general adult medical examination without abnormal findings: Secondary | ICD-10-CM

## 2019-04-09 LAB — LIPID PANEL
Cholesterol: 185 mg/dL (ref <200)
HDL: 60 mg/dL (ref >=40)
LDL Cholesterol (Calc): 111 mg/dL (calc) — ABNORMAL HIGH
Non-HDL Cholesterol (Calc): 125 mg/dL (calc) (ref <130)
Total CHOL/HDL Ratio: 3.1 (calc) (ref <5.0)
Triglycerides: 53 mg/dL (ref <150)

## 2019-04-09 LAB — COMPLETE METABOLIC PANEL WITH GFR
AG Ratio: 1.7 (calc) (ref 1.0–2.5)
ALT: 18 U/L (ref 9–46)
AST: 23 U/L (ref 10–35)
Albumin: 4.3 g/dL (ref 3.6–5.1)
Alkaline phosphatase (APISO): 73 U/L (ref 35–144)
BUN: 12 mg/dL (ref 7–25)
CO2: 26 mmol/L (ref 20–32)
Calcium: 9.1 mg/dL (ref 8.6–10.3)
Chloride: 105 mmol/L (ref 98–110)
Creat: 0.82 mg/dL (ref 0.70–1.33)
GFR, Est African American: 118 mL/min/{1.73_m2} (ref >=60)
GFR, Est Non African American: 102 mL/min/{1.73_m2} (ref >=60)
Globulin: 2.5 g/dL (calc) (ref 1.9–3.7)
Glucose, Bld: 116 mg/dL — ABNORMAL HIGH (ref 65–99)
Potassium: 4.9 mmol/L (ref 3.5–5.3)
Sodium: 139 mmol/L (ref 135–146)
Total Bilirubin: 1.1 mg/dL (ref 0.2–1.2)
Total Protein: 6.8 g/dL (ref 6.1–8.1)

## 2019-04-09 LAB — PSA: PSA: 0.3 ng/mL (ref <=4.0)

## 2019-04-09 LAB — TSH: TSH: 1.26 mIU/L (ref 0.40–4.50)

## 2019-04-09 NOTE — Patient Instructions (Signed)

## 2019-04-09 NOTE — Progress Notes (Signed)
CPE  Established Patient Office Visit  Subjective:  Patient ID: Roy Mueller, male    DOB: 01-03-1966  Age: 54 y.o. MRN: [id number]  CC:  Chief Complaint  Patient presents with  . Annual Exam    HPI Roy Mueller presents for CPE.  He tries to stay active by walking but because of his back problems cannot exercise intensely.  Mood has been good overall.  He did recently get poison ivy and we have called in a steroid cream he says it is finally getting better.  But he did want to let me know that he has a little spot near his left ankle that breaks out occasionally.  He says it first came up last year when he got poison ivy but it will come back intermittently in between without any exposure.  He says it usually improves with the topical steroid cream.  Past Medical History:  Diagnosis Date  . GERD (gastroesophageal reflux disease)     Past Surgical History:  Procedure Laterality Date  . BACK SURGERY    . c5-7 cervical diskectomy  2011   and fusion with Crystal Peek spacer  . CHOLECYSTECTOMY, LAPAROSCOPIC  2018    Family History  Problem Relation Age of Onset  . Heart attack Paternal Uncle   . Heart attack Paternal Uncle     Social History   Socioeconomic History  . Marital status: Married    Spouse name: [name]  . Number of children: 2   . Years of education: Not on file  . Highest education level: Not on file  Occupational History  . Occupation: Clinical biochemist.     Employer: [employer]    Comment: Technical brewer  Tobacco Use  . Smoking status: Former Smoker    Packs/day: 0.25    Years: 3.00    Pack years: 0.75    Types: Cigarettes    Quit date: 01/01/2013    Years since quitting: 6.2  . Smokeless tobacco: Never Used  Substance and Sexual Activity  . Alcohol use: Yes    Alcohol/week: 2.0 standard drinks    Types: 2 Cans of beer per week  . Drug use: Yes  . Sexual activity: Yes    Partners: Female  Other Topics Concern  . Not on file   Social History Narrative   + regular exercise.     Social Determinants of Health   Financial Resource Strain:   . Difficulty of Paying Living Expenses:   Food Insecurity:   . Worried About Charity fundraiser in the Last Year:   . Arboriculturist in the Last Year:   Transportation Needs:   . Film/video editor (Medical):   [name] Lack of Transportation (Non-Medical):   Physical Activity:   . Days of Exercise per Week:   . Minutes of Exercise per Session:   Stress:   . Feeling of Stress :   Social Connections:   . Frequency of Communication with Friends and Family:   . Frequency of Social Gatherings with Friends and Family:   . Attends Religious Services:   . Active Member of Clubs or Organizations:   . Attends Archivist Meetings:   [name] Marital Status:   Intimate Partner Violence:   . Fear of Current or Ex-Partner:   . Emotionally Abused:   [name] Physically Abused:   . Sexually Abused:     Outpatient Medications Prior to Visit  Medication Sig Dispense Refill  . HYDROcodone-acetaminophen (NORCO) 10-325 MG tablet Take  1 tablet by mouth every 6 (six) hours as needed.    [name] HYDROmorphone (DILAUDID) 4 MG tablet Take by mouth every 6 (six) hours as needed.    . triamcinolone cream (KENALOG) 0.1 % Apply 1 application topically 2 (two) times daily as needed. 45 g 0   No facility-administered medications prior to visit.    No Known Allergies  ROS Review of Systems    Objective:    Physical Exam  Constitutional: He is oriented to person, place, and time. He appears well-developed and well-nourished.  HENT:  Head: Normocephalic and atraumatic.  Right Ear: External ear normal.  Left Ear: External ear normal.  Nose: Nose normal.  Mouth/Throat: Oropharynx is clear and moist.  Eyes: Pupils are equal, round, and reactive to light. Conjunctivae and EOM are normal.  Neck: No thyromegaly present.  Cardiovascular: Normal rate, regular rhythm, normal heart sounds and intact  distal pulses.  Pulmonary/Chest: Effort normal and breath sounds normal.  Abdominal: Soft. Bowel sounds are normal. He exhibits no distension and no mass. There is no abdominal tenderness. There is no rebound and no guarding.  Musculoskeletal:        General: Normal range of motion.     Cervical back: Normal range of motion and neck supple.  Lymphadenopathy:    He has no cervical adenopathy.  Neurological: He is alert and oriented to person, place, and time. He has normal reflexes.  Skin: Skin is warm and dry.  Psychiatric: He has a normal mood and affect. His behavior is normal. Judgment and thought content normal.    BP 126/64   Pulse (!) 51   Ht 5\' 10"  (1.778 m)   Wt 169 lb (76.7 kg)   SpO2 100%   BMI 24.25 kg/m  Wt Readings from Last 3 Encounters:  04/09/19 169 lb (76.7 kg)  12/06/18 167 lb (75.8 kg)  11/21/18 164 lb (74.4 kg)     There are no preventive care reminders to display for this patient.  There are no preventive care reminders to display for this patient.  Lab Results  Component Value Date   TSH 1.26 04/09/2019   Lab Results  Component Value Date   WBC 6.0 11/21/2018   HGB 15.3 11/21/2018   HCT 45.8 11/21/2018   MCV 91.6 11/21/2018   PLT 296 11/21/2018   Lab Results  Component Value Date   NA 139 04/09/2019   K 4.9 04/09/2019   CO2 26 04/09/2019   GLUCOSE 116 (H) 04/09/2019   BUN 12 04/09/2019   CREATININE 0.82 04/09/2019   BILITOT 1.1 04/09/2019   ALKPHOS 80 08/16/2018   AST 23 04/09/2019   ALT 18 04/09/2019   PROT 6.8 04/09/2019   ALBUMIN 4.3 04/05/2013   CALCIUM 9.1 04/09/2019   Lab Results  Component Value Date   CHOL 185 04/09/2019   Lab Results  Component Value Date   HDL 60 04/09/2019   Lab Results  Component Value Date   LDLCALC 111 (H) 04/09/2019   Lab Results  Component Value Date   TRIG 53 04/09/2019   Lab Results  Component Value Date   CHOLHDL 3.1 04/09/2019   No results found for: HGBA1C    Assessment &  Plan:   Problem List Items Addressed This Visit      Other   Thiamin deficiency   Relevant Orders   Vitamin B1    Other Visit Diagnoses    Routine general medical examination at a health care facility    -  Primary   Relevant Orders   PSA (Completed)   TSH (Completed)   Vitamin B1   Testosterone Total,Free,Bio, Males (Completed)     Keep up a regular exercise program and make sure you are eating a healthy diet Try to eat 4 servings of dairy a day, or if you are lactose intolerant take a calcium with vitamin D daily.  Your vaccines are up to date.  The shingles vaccine as well.  He will think about it and let me know.  Would like to have testosterone levels checked as well.  Rash-the one on his leg that seems to be recurring looks more like eczema.  Okay used to use the triamcinolone cream on this.   Meds ordered this encounter  Medications  . Testosterone (FORTESTA) 10 MG/ACT (2%) GEL    Sig: 1 pump applied to each each inner thigh every morning.    Dispense:  60 g    Refill:  2    Follow-up: Return in about 1 year (around 04/08/2020) for Wellness Exam.    Beatrice Lecher, MD

## 2019-04-10 LAB — TESTOSTERONE TOTAL,FREE,BIO, MALES
Albumin: 4.3 g/dL (ref 3.6–5.1)
Sex Hormone Binding: 79 nmol/L — ABNORMAL HIGH (ref 10–50)
Testosterone, Bioavailable: 91.4 ng/dL — ABNORMAL LOW (ref >=110.0)
Testosterone, Free: 46.4 pg/mL (ref 46.0–224.0)
Testosterone: 723 ng/dL (ref 250–827)

## 2019-04-10 MED ORDER — TESTOSTERONE 10 MG/ACT (2%) TD GEL: TRANSDERMAL | 2 refills | Status: DC

## 2019-04-11 ENCOUNTER — Telehealth: Payer: Self-pay | Admitting: Family Medicine

## 2019-04-11 NOTE — Telephone Encounter (Signed)
Received fax for PA on Testosterone sent through cover my meds waiting on determination. - CF 

## 2019-04-12 NOTE — Progress Notes (Signed)
Late for appt

## 2019-04-16 ENCOUNTER — Encounter: Payer: Self-pay | Admitting: Family Medicine

## 2019-04-16 LAB — HEMOGLOBIN A1C W/OUT EAG

## 2019-04-16 LAB — VITAMIN B1: Vitamin B1 (Thiamine): 25 nmol/L (ref 8–30)

## 2019-04-16 NOTE — Telephone Encounter (Signed)
Received fax from [place] they denied coverage on Testosterone due to patient has not had 2 pre treatment serum testosterone measurements. Placing in providers box for further review. - CF

## 2019-04-17 ENCOUNTER — Telehealth: Payer: Self-pay | Admitting: *Deleted

## 2019-04-17 ENCOUNTER — Telehealth: Payer: Self-pay

## 2019-04-17 NOTE — Telephone Encounter (Signed)
Form completed,faxed,confirmation received and scanned into patient's chart.

## 2019-04-17 NOTE — Telephone Encounter (Signed)
Patient's wife called stating he had a physical form that was completed and faxed for ins. The insurance told patient that the date was missing from lab work.   Tonya, do you have this paperwork to fill in the missing date and re-fax?

## 2019-04-17 NOTE — Telephone Encounter (Signed)
Date has been added. Fax confirmation received.

## 2019-04-18 NOTE — Telephone Encounter (Signed)
Resubmitted PA for Testosterone and received authorization.   Case ID: [id number] Valid: 03/19/2019-04/17/2020 - CF

## 2019-05-30 ENCOUNTER — Encounter: Payer: Self-pay | Admitting: Family Medicine

## 2019-09-04 ENCOUNTER — Ambulatory Visit (INDEPENDENT_AMBULATORY_CARE_PROVIDER_SITE_OTHER): Payer: 59 | Admitting: Family Medicine

## 2019-09-04 ENCOUNTER — Other Ambulatory Visit: Payer: Self-pay

## 2019-09-04 ENCOUNTER — Encounter: Payer: Self-pay | Admitting: Family Medicine

## 2019-09-04 VITALS — BP 122/65 | HR 56 | Ht 70.0 in | Wt 165.0 lb

## 2019-09-04 DIAGNOSIS — T50Z95A Adverse effect of other vaccines and biological substances, initial encounter: Secondary | ICD-10-CM

## 2019-09-04 DIAGNOSIS — R590 Localized enlarged lymph nodes: Secondary | ICD-10-CM

## 2019-09-04 NOTE — Progress Notes (Signed)
Established Patient Office Visit  Subjective:  Patient ID: Roy Mueller, male    DOB: [birth year]  Age: 53 y.o. MRN: [id number]  CC:  Chief Complaint  Patient presents with  . pain under arm    HPI Roy Mueller presents for lump under the left axilla that start at the end of July.  He received his first COVID shot on 7/19, he says he started noticing the swelling about a week after the shot.  He says it lasted about a week and just gradually went down on its own he says it just felt tender and sore.  Then he received his second Covid vaccine on Monday, August 16 and the following day he noticed that the swelling had returned in the exact same place in the left axilla.  He denies any fever, chills or sweats or night sweats.  He denies any other swollen lumps or bumps.  He denies any rash, chest pain or shortness of breath.  He says again it still just feels tender or sore a most like a bruise.  He has been mostly doing ice packs.  He said he did not feel unwell after either injection he said he just felt lightheaded for a few minutes afterwards but that was it.  3 days ago has his 2nd [place] shot and the day after he notices a    Past Medical History:  Diagnosis Date  . GERD (gastroesophageal reflux disease)     Past Surgical History:  Procedure Laterality Date  . BACK SURGERY    . c5-7 cervical diskectomy  2011   and fusion with Crystal Peek spacer  . CHOLECYSTECTOMY, LAPAROSCOPIC  2018    Family History  Problem Relation Age of Onset  . Heart attack Paternal Uncle   . Heart attack Paternal Uncle     Social History   Socioeconomic History  . Marital status: Married    Spouse name: [name]  . Number of children: 2   . Years of education: Not on file  . Highest education level: Not on file  Occupational History  . Occupation: Clinical biochemist.     Employer: [employer]    Comment: Technical brewer  Tobacco Use  . Smoking status: Former Smoker     Packs/day: 0.25    Years: 3.00    Pack years: 0.75    Types: Cigarettes    Quit date: 01/01/2013    Years since quitting: 6.6  . Smokeless tobacco: Never Used  Vaping Use  . Vaping Use: Never used  Substance and Sexual Activity  . Alcohol use: Yes    Alcohol/week: 2.0 standard drinks    Types: 2 Cans of beer per week  . Drug use: Yes  . Sexual activity: Yes    Partners: Female  Other Topics Concern  . Not on file  Social History Narrative   + regular exercise.     Social Determinants of Health   Financial Resource Strain:   . Difficulty of Paying Living Expenses:   Food Insecurity:   . Worried About Charity fundraiser in the Last Year:   . Arboriculturist in the Last Year:   Transportation Needs:   . Film/video editor (Medical):   [name] Lack of Transportation (Non-Medical):   Physical Activity:   . Days of Exercise per Week:   . Minutes of Exercise per Session:   Stress:   . Feeling of Stress :   Social Connections:   . Frequency of Communication  with Friends and Family:   . Frequency of Social Gatherings with Friends and Family:   . Attends Religious Services:   . Active Member of Clubs or Organizations:   . Attends Archivist Meetings:   [name] Marital Status:   Intimate Partner Violence:   . Fear of Current or Ex-Partner:   . Emotionally Abused:   [name] Physically Abused:   . Sexually Abused:     Outpatient Medications Prior to Visit  Medication Sig Dispense Refill  . HYDROcodone-acetaminophen (NORCO) 10-325 MG tablet Take 1 tablet by mouth every 6 (six) hours as needed.    [name] HYDROmorphone (DILAUDID) 4 MG tablet Take by mouth every 6 (six) hours as needed.    . Testosterone (FORTESTA) 10 MG/ACT (2%) GEL 1 pump applied to each each inner thigh every morning. 60 g 2  . triamcinolone cream (KENALOG) 0.1 % Apply 1 application topically 2 (two) times daily as needed. 45 g 0   No facility-administered medications prior to visit.    No Known  Allergies  ROS Review of Systems    Objective:    Physical Exam  BP 122/65   Pulse (!) 56   Ht 5\' 10"  (1.778 m)   Wt 165 lb (74.8 kg)   SpO2 100%   BMI 23.68 kg/m  Wt Readings from Last 3 Encounters:  09/04/19 165 lb (74.8 kg)  04/09/19 169 lb (76.7 kg)  12/06/18 167 lb (75.8 kg)     Health Maintenance Due  Topic Date Due  . Hepatitis C Screening  Never done  . INFLUENZA VACCINE  08/18/2019    There are no preventive care reminders to display for this patient.  Lab Results  Component Value Date   TSH 1.26 04/09/2019   Lab Results  Component Value Date   WBC 6.0 11/21/2018   HGB 15.3 11/21/2018   HCT 45.8 11/21/2018   MCV 91.6 11/21/2018   PLT 296 11/21/2018   Lab Results  Component Value Date   NA 139 04/09/2019   K 4.9 04/09/2019   CO2 26 04/09/2019   GLUCOSE 116 (H) 04/09/2019   BUN 12 04/09/2019   CREATININE 0.82 04/09/2019   BILITOT 1.1 04/09/2019   ALKPHOS 80 08/16/2018   AST 23 04/09/2019   ALT 18 04/09/2019   PROT 6.8 04/09/2019   ALBUMIN 4.3 04/05/2013   CALCIUM 9.1 04/09/2019   Lab Results  Component Value Date   CHOL 185 04/09/2019   Lab Results  Component Value Date   HDL 60 04/09/2019   Lab Results  Component Value Date   LDLCALC 111 (H) 04/09/2019   Lab Results  Component Value Date   TRIG 53 04/09/2019   Lab Results  Component Value Date   CHOLHDL 3.1 04/09/2019   Lab Results  Component Value Date   HGBA1C CANCELED 04/09/2019      Assessment & Plan:   Problem List Items Addressed This Visit    None    Visit Diagnoses    Lymphadenopathy, axillary    -  Primary   Side effects of vaccination, initial encounter          Likely secondary to the vaccine.  Discussed that we have been seeing this pattern in some patients where they have been getting some axillary tenderness and swelling and even swollen lymph nodes so it is not uncommon.  Discussed monitoring the area.  Call if not better in 2-3 weeks and will  schedule [place] and CBC.  Please call sooner  if develops any new areas that are swollen, fever rash chills, or the lump itself gets larger.  No orders of the defined types were placed in this encounter.   Follow-up: No follow-ups on file.    Beatrice Lecher, MD

## 2020-05-29 ENCOUNTER — Other Ambulatory Visit: Payer: Self-pay

## 2020-05-29 ENCOUNTER — Encounter: Payer: Self-pay | Admitting: Family Medicine

## 2020-05-29 ENCOUNTER — Ambulatory Visit (INDEPENDENT_AMBULATORY_CARE_PROVIDER_SITE_OTHER): Payer: 59 | Admitting: Family Medicine

## 2020-05-29 VITALS — BP 126/74 | HR 69 | Ht 70.0 in | Wt 166.0 lb

## 2020-05-29 DIAGNOSIS — Z0000 Encounter for general adult medical examination without abnormal findings: Secondary | ICD-10-CM

## 2020-05-29 DIAGNOSIS — Z1159 Encounter for screening for other viral diseases: Secondary | ICD-10-CM

## 2020-05-29 NOTE — Progress Notes (Signed)
CPE  Established Patient Office Visit  Subjective:  Patient ID: Roy Mueller, male    DOB: 08/15/1965  Age: 55 y.o. MRN: [id number]  CC:  Chief Complaint  Patient presents with  . Annual Exam    HPI Roy Mueller presents for CPE.  He is doing well.  He stays active but is limited a little bit with his back pain.  Past Medical History:  Diagnosis Date  . GERD (gastroesophageal reflux disease)     Past Surgical History:  Procedure Laterality Date  . BACK SURGERY    . c5-7 cervical diskectomy  2011   and fusion with Crystal Peek spacer  . CHOLECYSTECTOMY, LAPAROSCOPIC  2018    Family History  Problem Relation Age of Onset  . Heart attack Paternal Uncle   . Heart attack Paternal Uncle     Social History   Socioeconomic History  . Marital status: Married    Spouse name: [name]  . Number of children: 2   . Years of education: Not on file  . Highest education level: Not on file  Occupational History  . Occupation: Clinical biochemist.     Employer: [employer]    Comment: Technical brewer  Tobacco Use  . Smoking status: Former Smoker    Packs/day: 0.25    Years: 3.00    Pack years: 0.75    Types: Cigarettes    Quit date: 01/01/2013    Years since quitting: 7.4  . Smokeless tobacco: Never Used  Vaping Use  . Vaping Use: Never used  Substance and Sexual Activity  . Alcohol use: Yes    Alcohol/week: 2.0 standard drinks    Types: 2 Cans of beer per week  . Drug use: Yes  . Sexual activity: Yes    Partners: Female  Other Topics Concern  . Not on file  Social History Narrative   + regular exercise.     Social Determinants of Health   Financial Resource Strain: Not on file  Food Insecurity: Not on file  Transportation Needs: Not on file  Physical Activity: Not on file  Stress: Not on file  Social Connections: Not on file  Intimate Partner Violence: Not on file    Outpatient Medications Prior to Visit  Medication Sig Dispense Refill  .  HYDROcodone-acetaminophen (NORCO) 10-325 MG tablet Take 1 tablet by mouth every 6 (six) hours as needed.    [name] HYDROmorphone (DILAUDID) 4 MG tablet Take by mouth every 6 (six) hours as needed.    . Testosterone (FORTESTA) 10 MG/ACT (2%) GEL 1 pump applied to each each inner thigh every morning. 60 g 2  . triamcinolone cream (KENALOG) 0.1 % Apply 1 application topically 2 (two) times daily as needed. 45 g 0   No facility-administered medications prior to visit.    No Known Allergies  ROS Review of Systems    Objective:    Physical Exam Constitutional:      Appearance: He is well-developed.  HENT:     Head: Normocephalic and atraumatic.     Right Ear: External ear normal.     Left Ear: External ear normal.     Nose: Nose normal.  Eyes:     Conjunctiva/sclera: Conjunctivae normal.     Pupils: Pupils are equal, round, and reactive to light.  Neck:     Thyroid: No thyromegaly.  Cardiovascular:     Rate and Rhythm: Normal rate and regular rhythm.     Heart sounds: Normal heart sounds.  Pulmonary:  Effort: Pulmonary effort is normal.     Breath sounds: Normal breath sounds.  Abdominal:     General: Bowel sounds are normal. There is no distension.     Palpations: Abdomen is soft. There is no mass.     Tenderness: There is no abdominal tenderness. There is no guarding or rebound.  Musculoskeletal:        General: Normal range of motion.     Cervical back: Normal range of motion and neck supple.  Lymphadenopathy:     Cervical: No cervical adenopathy.  Skin:    General: Skin is warm and dry.  Neurological:     Mental Status: He is alert and oriented to person, place, and time.     Deep Tendon Reflexes: Reflexes are normal and symmetric.  Psychiatric:        Behavior: Behavior normal.        Thought Content: Thought content normal.        Judgment: Judgment normal.     BP 126/74   Pulse 69   Ht 5\' 10"  (1.778 m)   Wt 166 lb (75.3 kg)   SpO2 100%   BMI 23.82 kg/m   Wt Readings from Last 3 Encounters:  05/29/20 166 lb (75.3 kg)  09/04/19 165 lb (74.8 kg)  04/09/19 169 lb (76.7 kg)     Health Maintenance Due  Topic Date Due  . Hepatitis C Screening  Never done  . COVID-19 Vaccine (3 - Booster) 02/02/2020    There are no preventive care reminders to display for this patient.  Lab Results  Component Value Date   TSH 1.26 04/09/2019   Lab Results  Component Value Date   WBC 6.0 11/21/2018   HGB 15.3 11/21/2018   HCT 45.8 11/21/2018   MCV 91.6 11/21/2018   PLT 296 11/21/2018   Lab Results  Component Value Date   NA 139 04/09/2019   K 4.9 04/09/2019   CO2 26 04/09/2019   GLUCOSE 116 (H) 04/09/2019   BUN 12 04/09/2019   CREATININE 0.82 04/09/2019   BILITOT 1.1 04/09/2019   ALKPHOS 80 08/16/2018   AST 23 04/09/2019   ALT 18 04/09/2019   PROT 6.8 04/09/2019   ALBUMIN 4.3 04/05/2013   CALCIUM 9.1 04/09/2019   Lab Results  Component Value Date   CHOL 185 04/09/2019   Lab Results  Component Value Date   HDL 60 04/09/2019   Lab Results  Component Value Date   LDLCALC 111 (H) 04/09/2019   Lab Results  Component Value Date   TRIG 53 04/09/2019   Lab Results  Component Value Date   CHOLHDL 3.1 04/09/2019   Lab Results  Component Value Date   HGBA1C CANCELED 04/09/2019      Assessment & Plan:   Problem List Items Addressed This Visit   None   Visit Diagnoses    Wellness examination    -  Primary   Relevant Orders   CBC   COMPLETE METABOLIC PANEL WITH GFR   Lipid panel   Hemoglobin A1c   PSA   Encounter for hepatitis C screening test for low risk patient       Relevant Orders   Hepatitis C Antibody     Keep up a regular exercise program and make sure you are eating a healthy diet Try to eat 4 servings of dairy a day, or if you are lactose intolerant take a calcium with vitamin D daily.  Your vaccines are up to date.  No orders of the defined types were placed in this encounter.   Follow-up: No  follow-ups on file.    Beatrice Lecher, MD

## 2020-05-29 NOTE — Patient Instructions (Signed)

## 2020-06-02 ENCOUNTER — Telehealth: Payer: Self-pay | Admitting: *Deleted

## 2020-06-02 LAB — COMPLETE METABOLIC PANEL WITH GFR
AG Ratio: 1.7 (calc) (ref 1.0–2.5)
ALT: 20 U/L (ref 9–46)
AST: 22 U/L (ref 10–35)
Albumin: 4.3 g/dL (ref 3.6–5.1)
Alkaline phosphatase (APISO): 73 U/L (ref 35–144)
BUN: 15 mg/dL (ref 7–25)
CO2: 29 mmol/L (ref 20–32)
Calcium: 9.3 mg/dL (ref 8.6–10.3)
Chloride: 104 mmol/L (ref 98–110)
Creat: 0.85 mg/dL (ref 0.70–1.33)
GFR, Est African American: 115 mL/min/{1.73_m2} (ref >=60)
GFR, Est Non African American: 99 mL/min/{1.73_m2} (ref >=60)
Globulin: 2.6 g/dL (calc) (ref 1.9–3.7)
Glucose, Bld: 103 mg/dL — ABNORMAL HIGH (ref 65–99)
Potassium: 4.5 mmol/L (ref 3.5–5.3)
Sodium: 140 mmol/L (ref 135–146)
Total Bilirubin: 0.9 mg/dL (ref 0.2–1.2)
Total Protein: 6.9 g/dL (ref 6.1–8.1)

## 2020-06-02 LAB — CBC
HCT: 47.7 % (ref 38.5–50.0)
Hemoglobin: 15.6 g/dL (ref 13.2–17.1)
MCH: 30.5 pg (ref 27.0–33.0)
MCHC: 32.7 g/dL (ref 32.0–36.0)
MCV: 93.3 fL (ref 80.0–100.0)
MPV: 9.3 fL (ref 7.5–12.5)
Platelets: 278 10*3/uL (ref 140–400)
RBC: 5.11 10*6/uL (ref 4.20–5.80)
RDW: 12.1 % (ref 11.0–15.0)
WBC: 5.3 10*3/uL (ref 3.8–10.8)

## 2020-06-02 LAB — LIPID PANEL
Cholesterol: 186 mg/dL (ref <200)
HDL: 58 mg/dL (ref >=40)
LDL Cholesterol (Calc): 112 mg/dL (calc) — ABNORMAL HIGH
Non-HDL Cholesterol (Calc): 128 mg/dL (calc) (ref <130)
Total CHOL/HDL Ratio: 3.2 (calc) (ref <5.0)
Triglycerides: 69 mg/dL (ref <150)

## 2020-06-02 LAB — HEPATITIS C ANTIBODY
Hepatitis C Ab: NONREACTIVE
SIGNAL TO CUT-OFF: 0.01 (ref <1.00)

## 2020-06-02 LAB — HEMOGLOBIN A1C
Hgb A1c MFr Bld: 5.2 % of total Hgb (ref <5.7)
Mean Plasma Glucose: 103 mg/dL
eAG (mmol/L): 5.7 mmol/L

## 2020-06-02 LAB — PSA: PSA: 0.23 ng/mL (ref <=4.00)

## 2020-06-02 NOTE — Telephone Encounter (Signed)
Form completed,faxed,confirmation received and scanned into patient's chart.

## 2021-02-24 ENCOUNTER — Other Ambulatory Visit: Payer: Self-pay

## 2021-02-24 ENCOUNTER — Encounter: Payer: Self-pay | Admitting: Family Medicine

## 2021-02-24 ENCOUNTER — Ambulatory Visit (INDEPENDENT_AMBULATORY_CARE_PROVIDER_SITE_OTHER): Payer: 59 | Admitting: Family Medicine

## 2021-02-24 VITALS — BP 138/89 | HR 75 | Resp 16 | Ht 70.0 in | Wt 166.0 lb

## 2021-02-24 DIAGNOSIS — Z0000 Encounter for general adult medical examination without abnormal findings: Secondary | ICD-10-CM | POA: Diagnosis not present

## 2021-02-24 DIAGNOSIS — F419 Anxiety disorder, unspecified: Secondary | ICD-10-CM | POA: Diagnosis not present

## 2021-02-24 DIAGNOSIS — K2100 Gastro-esophageal reflux disease with esophagitis, without bleeding: Secondary | ICD-10-CM | POA: Diagnosis not present

## 2021-02-24 MED ORDER — DEXLANSOPRAZOLE 30 MG PO CPDR: 30.0000 mg | DELAYED_RELEASE_CAPSULE | Freq: Every day | ORAL | 1 refills | Status: DC

## 2021-02-24 MED ORDER — BUPROPION HCL 75 MG PO TABS: 75.0000 mg | ORAL_TABLET | Freq: Two times a day (BID) | ORAL | 1 refills | Status: AC

## 2021-02-24 NOTE — Telephone Encounter (Signed)
This is complete.

## 2021-02-24 NOTE — Assessment & Plan Note (Addendum)
GAD-7 score of 7 today.  Rates his symptoms as somewhat difficult.  Negative for depression symptoms.  Did go ahead and send over Wellbutrin 75 mg he does not take it daily though he usually takes it as needed but does find it very helpful continue working on management strategies.  Call if any problems or concerns.

## 2021-02-24 NOTE — Assessment & Plan Note (Signed)
We discussed seeing if the insurance would cover the generic Dexilant we will go ahead and send over prescription to the pharmacy.  If not covered it sounds like he has not tried Aciphex so that might be another option that we could consider.  Also continue with lifestyle measures.

## 2021-02-24 NOTE — Progress Notes (Signed)
Established Patient Office Visit  Subjective:  Patient ID: Roy Mueller, male    DOB: Aug 10, 1965  Age: 56 y.o. MRN: [id number]  CC:  Chief Complaint  Patient presents with   Annual Exam    Fasting    Anxiety    Follow up    Gastroesophageal Reflux    Follow up. Dexilant has helped in the past.     HPI Roy Mueller presents for CPE.  He still exercising regularly.  Doing okay with work.  He did have a couple things he wanted to talk about he has noticed that his anxiety really ramped up in January has been an ongoing issue for years but he is usually been able to really manage it and count of talk himself down but he says occasionally he will just have episodes where for a couple of weeks that just really ramps up.  He was previously given a prescription for bupropion 75 mg and says when he does take that it really does help.  He says he is feeling a little better.  He could not quite figure out what was sort of triggering him back in January but it does seem a little better.  He also wanted to discuss finding something for his reflux.  He is tried a couple medications in the past including Prilosec and Nexium but Dexilant has always worked the best for him but his insurance would not cover it.  He saw online that it is generic now and wanted to see if his insurance might cover it.  Past Medical History:  Diagnosis Date   GERD (gastroesophageal reflux disease)     Past Surgical History:  Procedure Laterality Date   BACK SURGERY     c5-7 cervical diskectomy  2011   and fusion with Crystal Peek spacer   CHOLECYSTECTOMY, LAPAROSCOPIC  2018    Family History  Problem Relation Age of Onset   Healthy Mother    Healthy Father    Healthy Sister    Healthy Sister    Heart attack Paternal Uncle    Heart attack Paternal Uncle     Social History   Socioeconomic History   Marital status: Married    Spouse name: [name]   Number of children: 2    Years of education: Not on file    Highest education level: Not on file  Occupational History   Occupation: Clinical biochemist.     Employer: [employer]    Comment: Technical brewer  Tobacco Use   Smoking status: Former    Packs/day: 0.25    Years: 3.00    Pack years: 0.75    Types: Cigarettes    Quit date: 01/01/2013    Years since quitting: 8.1   Smokeless tobacco: Never  Vaping Use   Vaping Use: Never used  Substance and Sexual Activity   Alcohol use: Not Currently    Comment: occ wine   Drug use: Yes   Sexual activity: Yes    Partners: Female  Other Topics Concern   Not on file  Social History Narrative   Walk 4-5 days regular exercise.  3 cups of coffee a day    Social Determinants of Health   Financial Resource Strain: Not on file  Food Insecurity: Not on file  Transportation Needs: Not on file  Physical Activity: Not on file  Stress: Not on file  Social Connections: Not on file  Intimate Partner Violence: Not on file    Outpatient Medications Prior to Visit  Medication Sig Dispense Refill   HYDROcodone-acetaminophen (NORCO) 10-325 MG tablet Take 1 tablet by mouth every 6 (six) hours as needed.     HYDROmorphone (DILAUDID) 4 MG tablet Take by mouth every 6 (six) hours as needed.     Testosterone (FORTESTA) 10 MG/ACT (2%) GEL 1 pump applied to each each inner thigh every morning. 60 g 2   triamcinolone cream (KENALOG) 0.1 % Apply 1 application topically 2 (two) times daily as needed. 45 g 0   No facility-administered medications prior to visit.    No Known Allergies  ROS Review of Systems    Objective:    Physical Exam Constitutional:      Appearance: He is well-developed.  HENT:     Head: Normocephalic and atraumatic.     Right Ear: External ear normal.     Left Ear: External ear normal.     Nose: Nose normal.  Eyes:     Conjunctiva/sclera: Conjunctivae normal.     Pupils: Pupils are equal, round, and reactive to light.  Neck:     Thyroid: No thyromegaly.   Cardiovascular:     Rate and Rhythm: Normal rate and regular rhythm.     Heart sounds: Normal heart sounds.  Pulmonary:     Effort: Pulmonary effort is normal.     Breath sounds: Normal breath sounds.  Abdominal:     General: Bowel sounds are normal. There is no distension.     Palpations: Abdomen is soft. There is no mass.     Tenderness: There is no abdominal tenderness. There is no guarding or rebound.  Musculoskeletal:        General: Normal range of motion.     Cervical back: Normal range of motion and neck supple.  Lymphadenopathy:     Cervical: No cervical adenopathy.  Skin:    General: Skin is warm and dry.  Neurological:     Mental Status: He is alert and oriented to person, place, and time.     Deep Tendon Reflexes: Reflexes are normal and symmetric.  Psychiatric:        Behavior: Behavior normal.        Thought Content: Thought content normal.        Judgment: Judgment normal.    BP 138/89    Pulse 75    Resp 16    Ht 5\' 10"  (1.778 m)    Wt 166 lb (75.3 kg)    SpO2 99%    BMI 23.82 kg/m  Wt Readings from Last 3 Encounters:  02/24/21 166 lb (75.3 kg)  05/29/20 166 lb (75.3 kg)  09/04/19 165 lb (74.8 kg)     Health Maintenance Due  Topic Date Due   COVID-19 Vaccine (3 - Booster) 10/28/2019    There are no preventive care reminders to display for this patient.  Lab Results  Component Value Date   TSH 1.26 04/09/2019   Lab Results  Component Value Date   WBC 5.3 06/01/2020   HGB 15.6 06/01/2020   HCT 47.7 06/01/2020   MCV 93.3 06/01/2020   PLT 278 06/01/2020   Lab Results  Component Value Date   NA 140 06/01/2020   K 4.5 06/01/2020   CO2 29 06/01/2020   GLUCOSE 103 (H) 06/01/2020   BUN 15 06/01/2020   CREATININE 0.85 06/01/2020   BILITOT 0.9 06/01/2020   ALKPHOS 80 08/16/2018   AST 22 06/01/2020   ALT 20 06/01/2020   PROT 6.9 06/01/2020   ALBUMIN 4.3 04/05/2013  CALCIUM 9.3 06/01/2020   Lab Results  Component Value Date   CHOL 186  06/01/2020   Lab Results  Component Value Date   HDL 58 06/01/2020   Lab Results  Component Value Date   LDLCALC 112 (H) 06/01/2020   Lab Results  Component Value Date   TRIG 69 06/01/2020   Lab Results  Component Value Date   CHOLHDL 3.2 06/01/2020   Lab Results  Component Value Date   HGBA1C 5.2 06/01/2020      Assessment & Plan:   Problem List Items Addressed This Visit       Digestive   GERD    We discussed seeing if the insurance would cover the generic Dexilant we will go ahead and send over prescription to the pharmacy.  If not covered it sounds like he has not tried Aciphex so that might be another option that we could consider.  Also continue with lifestyle measures.      Relevant Medications   Dexlansoprazole 30 MG capsule DR     Other   Anxiety    GAD-7 score of 7 today.  Rates his symptoms as somewhat difficult.  Negative for depression symptoms.  Did go ahead and send over Wellbutrin 75 mg he does not take it daily though he usually takes it as needed but does find it very helpful continue working on management strategies.  Call if any problems or concerns.      Relevant Medications   buPROPion (WELLBUTRIN) 75 MG tablet   Other Visit Diagnoses     Wellness examination    -  Primary   Relevant Orders   Lipid Panel w/reflex Direct LDL   COMPLETE METABOLIC PANEL WITH GFR   CBC   PSA      CPE  Keep up a regular exercise program and make sure you are eating a healthy diet Try to eat 4 servings of dairy a day, or if you are lactose intolerant take a calcium with vitamin D daily.  We will get updated lab work today.  Meds ordered this encounter  Medications   Dexlansoprazole 30 MG capsule DR    Sig: Take 1 capsule (30 mg total) by mouth daily.    Dispense:  90 capsule    Refill:  1   buPROPion (WELLBUTRIN) 75 MG tablet    Sig: Take 1 tablet (75 mg total) by mouth 2 (two) times daily.    Dispense:  60 tablet    Refill:  1    Follow-up:  Return in about 1 year (around 02/24/2022) for Wellness Exam.    Beatrice Lecher, MD

## 2021-02-25 LAB — COMPLETE METABOLIC PANEL WITH GFR
AG Ratio: 1.6 (calc) (ref 1.0–2.5)
ALT: 25 U/L (ref 9–46)
AST: 26 U/L (ref 10–35)
Albumin: 4.4 g/dL (ref 3.6–5.1)
Alkaline phosphatase (APISO): 66 U/L (ref 35–144)
BUN: 14 mg/dL (ref 7–25)
CO2: 29 mmol/L (ref 20–32)
Calcium: 9.4 mg/dL (ref 8.6–10.3)
Chloride: 105 mmol/L (ref 98–110)
Creat: 0.75 mg/dL (ref 0.70–1.30)
Globulin: 2.7 g/dL (calc) (ref 1.9–3.7)
Glucose, Bld: 98 mg/dL (ref 65–99)
Potassium: 4.7 mmol/L (ref 3.5–5.3)
Sodium: 141 mmol/L (ref 135–146)
Total Bilirubin: 0.6 mg/dL (ref 0.2–1.2)
Total Protein: 7.1 g/dL (ref 6.1–8.1)
eGFR: 107 mL/min/{1.73_m2} (ref >=60)

## 2021-02-25 LAB — CBC
HCT: 47.9 % (ref 38.5–50.0)
Hemoglobin: 15.6 g/dL (ref 13.2–17.1)
MCH: 29.9 pg (ref 27.0–33.0)
MCHC: 32.6 g/dL (ref 32.0–36.0)
MCV: 91.9 fL (ref 80.0–100.0)
MPV: 9 fL (ref 7.5–12.5)
Platelets: 299 10*3/uL (ref 140–400)
RBC: 5.21 10*6/uL (ref 4.20–5.80)
RDW: 12.4 % (ref 11.0–15.0)
WBC: 4.7 10*3/uL (ref 3.8–10.8)

## 2021-02-25 LAB — LIPID PANEL W/REFLEX DIRECT LDL
Cholesterol: 203 mg/dL — ABNORMAL HIGH (ref <200)
HDL: 48 mg/dL (ref >=40)
LDL Cholesterol (Calc): 138 mg/dL (calc) — ABNORMAL HIGH
Non-HDL Cholesterol (Calc): 155 mg/dL (calc) — ABNORMAL HIGH (ref <130)
Total CHOL/HDL Ratio: 4.2 (calc) (ref <5.0)
Triglycerides: 78 mg/dL (ref <150)

## 2021-02-25 LAB — PSA: PSA: 0.27 ng/mL (ref <=4.00)

## 2021-02-25 NOTE — Progress Notes (Signed)
Hi Roy Mueller, LDL cholesterol went up slightly to 138.  Up about 20 points from your previous.  Just continue to work on healthy diet and regular exercise.  Your overall risk score is around 6% so it is not high enough that we need to start a medication but I still want to keep an eye on this yearly.  Your metabolic panel is normal.  Blood count and prostate test are normal.  The 10-year ASCVD risk score (Arnett DK, et al., 2019) is: 6.1%   Values used to calculate the score:     Age: 56 years     Sex: Male     Is Non-Hispanic African American: No     Diabetic: No     Tobacco smoker: No     Systolic Blood Pressure: 174 mmHg     Is BP treated: No     HDL Cholesterol: 48 mg/dL     Total Cholesterol: 203 mg/dL

## 2021-03-03 ENCOUNTER — Telehealth: Payer: Self-pay | Admitting: Family Medicine

## 2021-03-03 NOTE — Telephone Encounter (Addendum)
Pt's wife called to follow up on the status of the pre-authorization for acid reflex

## 2021-03-05 ENCOUNTER — Telehealth: Payer: Self-pay

## 2021-03-05 NOTE — Telephone Encounter (Signed)
Initiated Prior authorization [id number] 30MG  dr capsules Via: Covermymeds Case/Key:[id number] Status: denied as of 01/02/22 Reason:This medication may be excluded from the patient's benefit. Notified Pt via: Mychart

## 2021-03-10 MED ORDER — LANSOPRAZOLE 30 MG PO CPDR: 30.0000 mg | DELAYED_RELEASE_CAPSULE | Freq: Every day | ORAL | 3 refills | Status: DC

## 2021-03-10 NOTE — Addendum Note (Signed)
Addended by: Beatrice Lecher D on: 03/10/2021 02:47 PM   Modules accepted: Orders

## 2021-03-10 NOTE — Telephone Encounter (Signed)
Spoke to Pt in regards to his denial for Dexlansoprazole 30MG  dr capsules, I explained to the pt that his medications are not covered due to plan exclusion from his current benefit plan, pt expressed understanding and stated he would like to start a form of therapy that would be covered , he stated its been three weeks and is still experiencing discomfort and would like an alternative medication, he would like his medications sent to [facility] in [place]

## 2021-03-10 NOTE — Telephone Encounter (Signed)
Meds ordered this encounter  Medications   lansoprazole (PREVACID) 30 MG capsule    Sig: Take 1 capsule (30 mg total) by mouth daily before breakfast.    Dispense:  90 capsule    Refill:  3

## 2021-03-10 NOTE — Telephone Encounter (Signed)
Patient wife called inquiring about the alternative prescription since the first Dexlansoprazole was denied. Please advise.

## 2021-03-26 ENCOUNTER — Encounter: Payer: Self-pay | Admitting: Family Medicine

## 2021-03-26 MED ORDER — TIZANIDINE HCL 4 MG PO TABS: 4.0000 mg | ORAL_TABLET | Freq: Every evening | ORAL | 0 refills | Status: DC | PRN

## 2021-03-26 NOTE — Telephone Encounter (Signed)
Meds ordered this encounter  ?Medications  ? tiZANidine (ZANAFLEX) 4 MG tablet  ?  Sig: Take 1 tablet (4 mg total) by mouth at bedtime as needed for muscle spasms.  ?  Dispense:  90 tablet  ?  Refill:  0  ? ? ?

## 2021-06-09 ENCOUNTER — Other Ambulatory Visit (HOSPITAL_COMMUNITY): Payer: Self-pay | Admitting: Pain Medicine

## 2021-06-09 ENCOUNTER — Ambulatory Visit (HOSPITAL_COMMUNITY)
Admission: RE | Admit: 2021-06-09 | Discharge: 2021-06-09 | Disposition: A | Discharge status: Home / Self Care | Payer: 59 | Source: Ambulatory Visit | Attending: Pain Medicine | Admitting: Pain Medicine

## 2021-06-09 ENCOUNTER — Other Ambulatory Visit: Payer: Self-pay | Admitting: Pain Medicine

## 2021-06-09 DIAGNOSIS — M545 Low back pain, unspecified: Secondary | ICD-10-CM | POA: Insufficient documentation

## 2021-06-09 DIAGNOSIS — M5416 Radiculopathy, lumbar region: Secondary | ICD-10-CM | POA: Insufficient documentation

## 2022-04-12 ENCOUNTER — Ambulatory Visit (INDEPENDENT_AMBULATORY_CARE_PROVIDER_SITE_OTHER): Payer: 59 | Admitting: Family Medicine

## 2022-04-12 ENCOUNTER — Encounter: Payer: Self-pay | Admitting: Family Medicine

## 2022-04-12 VITALS — BP 124/74 | HR 55 | Ht 70.0 in | Wt 167.0 lb

## 2022-04-12 DIAGNOSIS — Z23 Encounter for immunization: Secondary | ICD-10-CM

## 2022-04-12 DIAGNOSIS — W450XXA Nail entering through skin, initial encounter: Secondary | ICD-10-CM

## 2022-04-12 DIAGNOSIS — Z0000 Encounter for general adult medical examination without abnormal findings: Secondary | ICD-10-CM

## 2022-04-12 NOTE — Progress Notes (Signed)
Complete physical exam  Patient: Roy Mueller   DOB: 17-May-1965   57 y.o. Male  MRN: [id number]  Subjective:    Chief Complaint  Patient presents with   Annual Exam    Roy Mueller is a 57 y.o. male who presents today for a complete physical exam. He reports consuming a general diet.  Walking some for exercise   He generally feels well. He reports sleeping well. He does have additional problems to discuss today.   Wore a pair of boots in November and it traumatized his great toes and 2nd toenail on right foot.  The nails were tender and the 2nd toenail fell off.  The great nails had broken blood vessels underneath.    Most recent fall risk assessment:    04/12/2022    9:32 AM  [place] in the past year? 0  Number falls in past yr: 0  Injury with Fall? 0  Risk for fall due to : No Fall Risks  Follow up Falls evaluation completed     Most recent depression screenings:    04/12/2022    9:30 AM 02/24/2021   11:42 AM  PHQ 2/9 Scores  PHQ - 2 Score 0 0        Patient Care Team: Hali Marry, MD as PCP - General (Family Medicine) Louie Bun, MD as Referring Physician (Pain Medicine)   Outpatient Medications Prior to Visit  Medication Sig   buPROPion (WELLBUTRIN) 75 MG tablet Take 1 tablet (75 mg total) by mouth 2 (two) times daily.   HYDROcodone-acetaminophen (NORCO) 10-325 MG tablet Take 1 tablet by mouth every 6 (six) hours as needed.   HYDROmorphone (DILAUDID) 4 MG tablet Take by mouth every 6 (six) hours as needed.   lansoprazole (PREVACID) 30 MG capsule Take 1 capsule (30 mg total) by mouth daily before breakfast.   tiZANidine (ZANAFLEX) 4 MG tablet Take 1 tablet (4 mg total) by mouth at bedtime as needed for muscle spasms.   triamcinolone cream (KENALOG) 0.1 % Apply 1 application topically 2 (two) times daily as needed.   [DISCONTINUED] Testosterone (FORTESTA) 10 MG/ACT (2%) GEL 1 pump applied to each each inner thigh every morning.   No  facility-administered medications prior to visit.    ROS        Objective:     BP 124/74   Pulse (!) 55   Ht 5\' 10"  (1.778 m)   Wt 167 lb (75.8 kg)   SpO2 100%   BMI 23.96 kg/m    Physical Exam Constitutional:      Appearance: He is well-developed.  HENT:     Head: Normocephalic and atraumatic.     Right Ear: Tympanic membrane, ear canal and external ear normal.     Left Ear: Tympanic membrane, ear canal and external ear normal.     Nose: Nose normal.     Mouth/Throat:     Pharynx: Oropharynx is clear.  Eyes:     Conjunctiva/sclera: Conjunctivae normal.     Pupils: Pupils are equal, round, and reactive to light.  Neck:     Thyroid: No thyromegaly.  Cardiovascular:     Rate and Rhythm: Normal rate and regular rhythm.     Heart sounds: Normal heart sounds.  Pulmonary:     Effort: Pulmonary effort is normal.     Breath sounds: Normal breath sounds.  Abdominal:     General: Bowel sounds are normal. There is no distension.  Palpations: Abdomen is soft. There is no mass.     Tenderness: There is no abdominal tenderness. There is no guarding or rebound.  Musculoskeletal:        General: Normal range of motion.     Cervical back: Normal range of motion and neck supple. No tenderness.  Lymphadenopathy:     Cervical: No cervical adenopathy.  Skin:    General: Skin is warm and dry.     Comments: He has a dark hematoma underneath the left great toenail.  He has what looks more like a light pink underneath the right great toenail.  Neurological:     Mental Status: He is alert and oriented to person, place, and time.     Deep Tendon Reflexes: Reflexes are normal and symmetric.  Psychiatric:        Behavior: Behavior normal.        Thought Content: Thought content normal.        Judgment: Judgment normal.      No results found for any visits on 04/12/22.     Assessment & Plan:    Routine Health Maintenance and Physical Exam  Immunization History   Administered Date(s) Administered   Moderna Sars-Covid-2 Vaccination 08/14/2019, 09/02/2019   Td 01/17/2005   Tdap 11/18/2011, 04/12/2022    Health Maintenance  Topic Date Due   INFLUENZA VACCINE  04/17/2022 (Originally 08/17/2021)   Pneumococcal Vaccine 60-58 Years old (1 of 2 - PCV) 04/12/2023 (Originally 12/08/1972)   COVID-19 Vaccine (3 - 2023-24 season) 04/28/2023 (Originally 09/17/2021)   Zoster Vaccines- Shingrix (1 of 2) 07/13/2023 (Originally 12/08/2016)   HIV Screening  11/21/2023 (Originally 12/08/1981)   COLONOSCOPY (Pts 45-58yrs Insurance coverage will need to be confirmed)  01/07/2029   DTaP/Tdap/Td (4 - Td or Tdap) 04/11/2032   Hepatitis C Screening  Completed   HPV VACCINES  Aged Out    Discussed health benefits of physical activity, and encouraged him to engage in regular exercise appropriate for his age and condition.  Problem List Items Addressed This Visit   None Visit Diagnoses     Wellness examination    -  Primary   Relevant Orders   COMPLETE METABOLIC PANEL WITH GFR   Lipid Panel w/reflex Direct LDL   HgB A1c   CBC   PSA   Injury by nail, initial encounter          Keep up a regular exercise program and make sure you are eating a healthy diet Try to eat 4 servings of dairy a day, or if you are lactose intolerant take a calcium with vitamin D daily.  Your vaccines are up to date.   Nail injury-it looks like there is some good nail growing out at the base.  So encouraged him to give it another 3 months to see if he continues to see good nail growing.  But if it does not seem to be improving happy to refer to podiatry for further workup.   Return in about 1 year (around 04/12/2023) for Wellness Exam.     Beatrice Lecher, MD

## 2022-04-13 LAB — CBC
HCT: 47.6 % (ref 38.5–50.0)
Hemoglobin: 15.7 g/dL (ref 13.2–17.1)
MCH: 30 pg (ref 27.0–33.0)
MCHC: 33 g/dL (ref 32.0–36.0)
MCV: 90.8 fL (ref 80.0–100.0)
MPV: 9.3 fL (ref 7.5–12.5)
Platelets: 291 10*3/uL (ref 140–400)
RBC: 5.24 10*6/uL (ref 4.20–5.80)
RDW: 12.5 % (ref 11.0–15.0)
WBC: 3.8 10*3/uL (ref 3.8–10.8)

## 2022-04-13 LAB — COMPLETE METABOLIC PANEL WITH GFR
AG Ratio: 1.7 (calc) (ref 1.0–2.5)
ALT: 26 U/L (ref 9–46)
AST: 25 U/L (ref 10–35)
Albumin: 4.3 g/dL (ref 3.6–5.1)
Alkaline phosphatase (APISO): 65 U/L (ref 35–144)
BUN: 13 mg/dL (ref 7–25)
CO2: 31 mmol/L (ref 20–32)
Calcium: 9.2 mg/dL (ref 8.6–10.3)
Chloride: 105 mmol/L (ref 98–110)
Creat: 0.87 mg/dL (ref 0.70–1.30)
Globulin: 2.5 g/dL (calc) (ref 1.9–3.7)
Glucose, Bld: 108 mg/dL — ABNORMAL HIGH (ref 65–99)
Potassium: 5 mmol/L (ref 3.5–5.3)
Sodium: 141 mmol/L (ref 135–146)
Total Bilirubin: 0.9 mg/dL (ref 0.2–1.2)
Total Protein: 6.8 g/dL (ref 6.1–8.1)
eGFR: 102 mL/min/{1.73_m2} (ref >=60)

## 2022-04-13 LAB — HEMOGLOBIN A1C
Hgb A1c MFr Bld: 5.6 % of total Hgb (ref <5.7)
Mean Plasma Glucose: 114 mg/dL
eAG (mmol/L): 6.3 mmol/L

## 2022-04-13 LAB — LIPID PANEL W/REFLEX DIRECT LDL
Cholesterol: 204 mg/dL — ABNORMAL HIGH (ref <200)
HDL: 63 mg/dL (ref >=40)
LDL Cholesterol (Calc): 126 mg/dL (calc) — ABNORMAL HIGH
Non-HDL Cholesterol (Calc): 141 mg/dL (calc) — ABNORMAL HIGH (ref <130)
Total CHOL/HDL Ratio: 3.2 (calc) (ref <5.0)
Triglycerides: 59 mg/dL (ref <150)

## 2022-04-13 LAB — PSA: PSA: 0.24 ng/mL (ref <=4.00)

## 2022-04-13 NOTE — Progress Notes (Signed)
Hi Roy Mueller, metabolic panel looks great.  LDL cholesterol is still elevated but it does look better this year compared to last year.  Not quite as good as it was in 2022.  Your A1c is 5.6 which is still in the normal range but remember 5.7 is prediabetes.  No anemia.  Prostate test is normal.

## 2022-05-05 ENCOUNTER — Other Ambulatory Visit: Payer: Self-pay | Admitting: Family Medicine

## 2023-04-13 ENCOUNTER — Encounter: Payer: Self-pay | Admitting: Family Medicine

## 2023-04-13 ENCOUNTER — Ambulatory Visit (INDEPENDENT_AMBULATORY_CARE_PROVIDER_SITE_OTHER): Payer: 59 | Admitting: Family Medicine

## 2023-04-13 VITALS — BP 122/76 | HR 58 | Ht 70.0 in | Wt 162.0 lb

## 2023-04-13 DIAGNOSIS — E041 Nontoxic single thyroid nodule: Secondary | ICD-10-CM

## 2023-04-13 DIAGNOSIS — Z0000 Encounter for general adult medical examination without abnormal findings: Secondary | ICD-10-CM | POA: Diagnosis not present

## 2023-04-13 MED ORDER — TIZANIDINE HCL 4 MG PO TABS: 4.0000 mg | ORAL_TABLET | Freq: Every evening | ORAL | 0 refills | Status: AC | PRN

## 2023-04-13 NOTE — Progress Notes (Signed)
 Complete physical exam  Patient: Roy Mueller   DOB: 01-27-1965   58 y.o. Male  MRN: [id number]  Subjective:    Chief Complaint  Patient presents with   Annual Exam    Roy Mueller is a 58 y.o. male who presents today for a complete physical exam. He reports consuming a general diet.  [place] for exercise.   He generally feels well. He reports sleeping well. He does have additional problems to discuss today.   Sees pain management - and getting injections.     Most recent fall risk assessment:    04/13/2023    8:43 AM  Fall Risk   Falls in the past year? 0  Number falls in past yr: 0  Injury with Fall? 0  Risk for fall due to : No Fall Risks  Follow up Falls evaluation completed     Most recent depression screenings:    04/13/2023    8:43 AM 04/12/2022    9:30 AM  PHQ 2/9 Scores  PHQ - 2 Score 0 0        Patient Care Team: Agapito Games, MD as PCP - General (Family Medicine) Audria Nine, MD as Referring Physician (Pain Medicine)   Outpatient Medications Prior to Visit  Medication Sig   buPROPion (WELLBUTRIN) 75 MG tablet Take 1 tablet (75 mg total) by mouth 2 (two) times daily.   lansoprazole (PREVACID) 30 MG capsule TAKE 1 CAPSULE BY MOUTH EVERY MORNING BEFORE BREAKFAST   naproxen (NAPROSYN) 500 MG tablet Take 500 mg by mouth 2 (two) times daily with a meal.   [DISCONTINUED] tiZANidine (ZANAFLEX) 4 MG tablet Take 1 tablet (4 mg total) by mouth at bedtime as needed for muscle spasms.   [DISCONTINUED] HYDROcodone-acetaminophen (NORCO) 10-325 MG tablet Take 1 tablet by mouth every 6 (six) hours as needed.   [DISCONTINUED] HYDROmorphone (DILAUDID) 4 MG tablet Take by mouth every 6 (six) hours as needed.   [DISCONTINUED] triamcinolone cream (KENALOG) 0.1 % Apply 1 application topically 2 (two) times daily as needed.   No facility-administered medications prior to visit.    ROS        Objective:     BP 122/76   Pulse (!) 58   Ht 5\' 10"  (1.778  m)   Wt 162 lb (73.5 kg)   SpO2 100%   BMI 23.24 kg/m    Physical Exam Constitutional:      Appearance: Normal appearance.  HENT:     Head: Normocephalic and atraumatic.     Right Ear: Tympanic membrane, ear canal and external ear normal.     Left Ear: Tympanic membrane, ear canal and external ear normal.     Nose: Nose normal.     Mouth/Throat:     Pharynx: Oropharynx is clear.  Eyes:     Extraocular Movements: Extraocular movements intact.     Conjunctiva/sclera: Conjunctivae normal.     Pupils: Pupils are equal, round, and reactive to light.  Neck:     Thyroid: No thyromegaly.  Cardiovascular:     Rate and Rhythm: Normal rate and regular rhythm.  Pulmonary:     Effort: Pulmonary effort is normal.     Breath sounds: Normal breath sounds.  Abdominal:     General: Bowel sounds are normal.     Palpations: Abdomen is soft.     Tenderness: There is no abdominal tenderness.  Musculoskeletal:        General: No swelling.     Cervical back: Neck supple.  Skin:    General: Skin is warm and dry.  Neurological:     Mental Status: He is oriented to person, place, and time.  Psychiatric:        Mood and Affect: Mood normal.        Behavior: Behavior normal.      No results found for any visits on 04/13/23.     Assessment & Plan:    Routine Health Maintenance and Physical Exam  Immunization History  Administered Date(s) Administered   Moderna Sars-Covid-2 Vaccination 08/14/2019, 09/02/2019   Td 01/17/2005   Tdap 11/18/2011, 04/12/2022    Health Maintenance  Topic Date Due   COVID-19 Vaccine (3 - 2024-25 season) 09/18/2022   INFLUENZA VACCINE  04/17/2023 (Originally 08/18/2022)   Zoster Vaccines- Shingrix (1 of 2) 07/13/2023 (Originally 12/08/2016)   HIV Screening  11/21/2023 (Originally 12/08/1981)   Colonoscopy  01/07/2029   DTaP/Tdap/Td (4 - Td or Tdap) 04/11/2032   Hepatitis C Screening  Completed   HPV VACCINES  Aged Out    Discussed health benefits of  physical activity, and encouraged him to engage in regular exercise appropriate for his age and condition.  Problem List Items Addressed This Visit       Endocrine   Thyroid nodule   Relevant Orders   TSH   T4, free   Other Visit Diagnoses       Wellness examination    -  Primary   Relevant Orders   PSA   CBC   HgB A1c   Lipid Panel With LDL/HDL Ratio   CMP14+EGFR   TSH   T4, free      Keep up a regular exercise program and make sure you are eating a healthy diet Try to eat 4 servings of dairy a day, or if you are lactose intolerant take a calcium with vitamin D daily.  Your vaccines are up to date.   Return in about 1 year (around 04/12/2024) for Wellness Exam.     Nani Gasser, MD

## 2023-04-14 ENCOUNTER — Encounter: Payer: Self-pay | Admitting: Family Medicine

## 2023-04-14 LAB — CBC
Hematocrit: 48.6 % (ref 37.5–51.0)
Hemoglobin: 15.7 g/dL (ref 13.0–17.7)
MCH: 29.7 pg (ref 26.6–33.0)
MCHC: 32.3 g/dL (ref 31.5–35.7)
MCV: 92 fL (ref 79–97)
Platelets: 295 10*3/uL (ref 150–450)
RBC: 5.28 x10E6/uL (ref 4.14–5.80)
RDW: 12 % (ref 11.6–15.4)
WBC: 4.9 10*3/uL (ref 3.4–10.8)

## 2023-04-14 LAB — CMP14+EGFR
ALT: 31 IU/L (ref 0–44)
AST: 24 IU/L (ref 0–40)
Albumin: 4.4 g/dL (ref 3.8–4.9)
Alkaline Phosphatase: 83 IU/L (ref 44–121)
BUN/Creatinine Ratio: 17 (ref 9–20)
BUN: 17 mg/dL (ref 6–24)
Bilirubin Total: 0.3 mg/dL (ref 0.0–1.2)
CO2: 23 mmol/L (ref 20–29)
Calcium: 9.4 mg/dL (ref 8.7–10.2)
Chloride: 102 mmol/L (ref 96–106)
Creatinine, Ser: 1 mg/dL (ref 0.76–1.27)
Globulin, Total: 2.8 g/dL (ref 1.5–4.5)
Glucose: 106 mg/dL — ABNORMAL HIGH (ref 70–99)
Potassium: 5.4 mmol/L — ABNORMAL HIGH (ref 3.5–5.2)
Sodium: 140 mmol/L (ref 134–144)
Total Protein: 7.2 g/dL (ref 6.0–8.5)
eGFR: 88 mL/min/{1.73_m2} (ref >59)

## 2023-04-14 LAB — LIPID PANEL WITH LDL/HDL RATIO
Cholesterol, Total: 252 mg/dL — ABNORMAL HIGH (ref 100–199)
HDL: 52 mg/dL (ref >39)
LDL Chol Calc (NIH): 185 mg/dL — ABNORMAL HIGH (ref 0–99)
LDL/HDL Ratio: 3.6 ratio (ref 0.0–3.6)
Triglycerides: 88 mg/dL (ref 0–149)
VLDL Cholesterol Cal: 15 mg/dL (ref 5–40)

## 2023-04-14 LAB — HEMOGLOBIN A1C
Est. average glucose Bld gHb Est-mCnc: 111 mg/dL
Hgb A1c MFr Bld: 5.5 % (ref 4.8–5.6)

## 2023-04-14 LAB — PSA: Prostate Specific Ag, Serum: 0.3 ng/mL (ref 0.0–4.0)

## 2023-04-14 LAB — TSH: TSH: 1.79 u[IU]/mL (ref 0.450–4.500)

## 2023-04-14 LAB — T4, FREE: Free T4: 1.14 ng/dL (ref 0.82–1.77)

## 2023-04-14 NOTE — Progress Notes (Signed)
 Hi Roy Mueller, total cholesterol and LDL are quite elevated.  Your 10-year of cardiovascular risk or 7% meaning you have a 7% increased risk for heart attack or stroke in the next 10 years.  At this point I just would encourage continuing to work on healthy diet which I know you are really doing a lot of work on and trying to increase your exercise activity level if possible.  I know you have some orthopedic limitations.  Will keep an eye on it.  But if it stays this elevated then we really may need to discuss a statin to lower your risk.  Potassium was just slightly elevated not sure why it could be there was little hemolysis of the blood sample.  Normal your potassium is normal.  Lets recheck this in 1 to 2 weeks.  Blood count and prostate test are normal.  No sign of diabetes.  Thyroid looks great.  The 10-year ASCVD risk score (Arnett DK, et al., 2019) is: 7%   Values used to calculate the score:     Age: 57 years     Sex: Male     Is Non-Hispanic African American: No     Diabetic: No     Tobacco smoker: No     Systolic Blood Pressure: 122 mmHg     Is BP treated: No     HDL Cholesterol: 52 mg/dL     Total Cholesterol: 252 mg/dL

## 2023-04-17 ENCOUNTER — Other Ambulatory Visit: Payer: Self-pay

## 2023-04-17 DIAGNOSIS — E875 Hyperkalemia: Secondary | ICD-10-CM

## 2023-07-03 ENCOUNTER — Encounter: Payer: Self-pay | Admitting: Family Medicine

## 2023-07-03 ENCOUNTER — Ambulatory Visit (INDEPENDENT_AMBULATORY_CARE_PROVIDER_SITE_OTHER): Admitting: Family Medicine

## 2023-07-03 ENCOUNTER — Telehealth: Payer: Self-pay | Admitting: Family Medicine

## 2023-07-03 VITALS — BP 112/64 | HR 57 | Ht 70.0 in | Wt 166.2 lb

## 2023-07-03 DIAGNOSIS — M5442 Lumbago with sciatica, left side: Secondary | ICD-10-CM

## 2023-07-03 DIAGNOSIS — M51362 Other intervertebral disc degeneration, lumbar region with discogenic back pain and lower extremity pain: Secondary | ICD-10-CM

## 2023-07-03 DIAGNOSIS — M51369 Other intervertebral disc degeneration, lumbar region without mention of lumbar back pain or lower extremity pain: Secondary | ICD-10-CM | POA: Insufficient documentation

## 2023-07-03 DIAGNOSIS — G8929 Other chronic pain: Secondary | ICD-10-CM

## 2023-07-03 DIAGNOSIS — M5441 Lumbago with sciatica, right side: Secondary | ICD-10-CM | POA: Diagnosis not present

## 2023-07-03 MED ORDER — NAPROXEN 500 MG PO TABS: 500.0000 mg | ORAL_TABLET | Freq: Two times a day (BID) | ORAL | 1 refills | Status: AC

## 2023-07-03 NOTE — Telephone Encounter (Signed)
 Will do, thank you

## 2023-07-03 NOTE — Progress Notes (Signed)
 Acute Office Visit  Subjective:     Patient ID: Roy Mueller, male    DOB: 05-Apr-1965, 58 y.o.   MRN: [id number]  Chief Complaint  Patient presents with   Back Pain    Pt states his back went out on him 4 days ago and hasn't subsided. Has been taking pain meds which has helped some. Is requesting an updated MRI    HPI Patient is in today for   Pt states his back went out on him 4 days ago and hasn't subsided. Has been taking pain meds which has helped some. Is requesting an updated MRI.  Last MRI 05/2011.  He does get occasional injections into his back he says the last 1 was in January he thinks it may just be wearing off.  He does have Zanaflex  which we refilled a couple of months ago.  He does need a refill on his naproxen as well.  Pain intermittently goes down his leg today it is not it is just mostly in his back.  He has a lot of spasm and stiffness on exam      Study Result  Narrative & Impression  CLINICAL DATA:  Chronic low back pain.  History of lumbar surgery.   EXAM: MRI LUMBAR SPINE WITHOUT CONTRAST   TECHNIQUE: Multiplanar, multisequence MR imaging of the lumbar spine was performed. No intravenous contrast was administered.   COMPARISON:  None Available.   FINDINGS: Segmentation: 5 lumbar type vertebrae based on the available coverage   Alignment:  Physiologic.   Vertebrae: No fracture, evidence of discitis, or bone lesion. There is some dark T1 signal around the left sacroiliac joint which is indistinct on T1 weighted imaging, question sacroiliitis.   Conus medullaris and cauda equina: Conus extends to the L1 level. Conus and cauda equina appear normal.   Paraspinal and other soft tissues: Negative for perispinal mass or inflammation   Disc levels:   T12- L1: Unremarkable.   L1-L2: Unremarkable.   L2-L3: Unremarkable.   L3-L4: Disc bulging with small left foraminal protrusion at an annular fissure. Negative facets   L4-L5: Disc narrowing  and bulging with posterior annular fissuring. Negative facets   L5-S1:Disc collapse and endplate degeneration with mild disc bulging and endplate spurring.   IMPRESSION: 1. Lumbar degenerative change greatest at the L5-S1 disc space where there is discogenic endplate edema. No neural impingement. 2. 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 Please correlate for any symptoms of inflammatory spondyloarthropathy    ROS      Objective:    BP 112/64   Pulse (!) 57   Ht 5' 10 (1.778 m)   Wt 166 lb 4 oz (75.4 kg)   SpO2 100%   BMI 23.85 kg/m    Physical Exam Vitals reviewed.  Constitutional:      Appearance: Normal appearance.  HENT:     Head: Normocephalic.  Pulmonary:     Effort: Pulmonary effort is normal.   Musculoskeletal:     Comments: Trace lumbar flexion and extension.  He is able to rotate but had more pain with rotation to the right compared to the left.  Patellar reflexes 2+.  Hip strength is 4 out of 5 on the right and 4 out of 5 on the left.  Knee and ankle strength is 5 out of 5.   Neurological:     Mental Status: He is alert and oriented to person, place, and time.   Psychiatric:  Mood and Affect: Mood normal.        Behavior: Behavior normal.     No results found for any visits on 07/03/23.      Assessment & Plan:   Problem List Items Addressed This Visit       Nervous and Auditory   Chronic bilateral low back pain with bilateral sciatica - Primary   Chronic pain and does get injections done 2-3 times a year that does provide pain relief for a while he is currently on naproxen and tizanidine  for pain relief.  Will get updated MRI since he has been having more flares recently.      Relevant Medications   naproxen (NAPROSYN) 500 MG tablet   Other Relevant Orders   MR Lumbar Spine Wo Contrast     Musculoskeletal and Integument   DDD (degenerative disc disease), lumbar   Relevant Orders   MR Lumbar Spine Wo Contrast     Meds ordered this encounter  Medications   naproxen (NAPROSYN) 500 MG tablet    Sig: Take 1 tablet (500 mg total) by mouth 2 (two) times daily with a meal.    Dispense:  180 tablet    Refill:  1    Return in about 6 months (around 01/02/2024).  Duaine German, MD

## 2023-07-03 NOTE — Telephone Encounter (Signed)
 Copied from CRM [id number]. Topic: Referral - Prior Authorization Question >> Jul 03, 2023  3:04 PM Carrielelia G wrote: Reason for CRM: [name] with [employer], stated if you want to expedite patient Roy Mueller's MRI authorization please call the provider at [phone] or  fax [phone].  References: Case no.  [id number]

## 2023-07-03 NOTE — Telephone Encounter (Signed)
 Attn: Almira Armour   Pt's wife is calling and would like a call back to today.  Regarding her husband, Pt Brumbaugh's MRI  Please advise

## 2023-07-03 NOTE — Assessment & Plan Note (Signed)
 Chronic pain and does get injections done 2-3 times a year that does provide pain relief for a while he is currently on naproxen and tizanidine  for pain relief.  Will get updated MRI since he has been having more flares recently.

## 2023-07-04 ENCOUNTER — Telehealth: Payer: Self-pay

## 2023-07-04 NOTE — Telephone Encounter (Signed)
 Patient wife called  Had [employer] on the line with call.  Stating that [employer] had not received any clinical documentation with the PA that was submitted for the MRI lumbar spine.  I asked what was needed  She states the OV notes dated 07/03/23 and demographics  I faxed these both to [phone]  With Case # [id number] on fax cover page.

## 2023-07-04 NOTE — Telephone Encounter (Signed)
 I also faxed the notes.

## 2023-07-04 NOTE — Telephone Encounter (Signed)
 Thank you Anairis!

## 2023-08-15 ENCOUNTER — Telehealth: Payer: Self-pay

## 2023-08-17 ENCOUNTER — Ambulatory Visit: Payer: Self-pay | Admitting: Family Medicine

## 2023-08-17 NOTE — Progress Notes (Signed)
 Hi Roy Mueller, we got your MRI report back it does show a new right sided herniated disc at L2-L3.  Are you wanting us  to forward this to one of your specialist?  Or are you wanting to see our sports med orthopedist here or consider formal physical therapy

## 2023-09-07 NOTE — Telephone Encounter (Signed)
 Error no encounter needed.

## 2024-04-15 ENCOUNTER — Encounter: Admitting: Family Medicine
